# Patient Record
Sex: Male | Born: 1965
Health system: Southern US, Community
[De-identification: ages and names within clinical notes are randomized; demographics above are authoritative.]

## PROBLEM LIST (undated history)

## (undated) DIAGNOSIS — R51 Headache: Secondary | ICD-10-CM

## (undated) DIAGNOSIS — E039 Hypothyroidism, unspecified: Secondary | ICD-10-CM

## (undated) HISTORY — PX: VARICOCELECTOMY: SHX1084

## (undated) HISTORY — PX: HERNIA REPAIR: SHX51

---

## 2005-12-24 HISTORY — PX: COLONOSCOPY: SHX174

## 2011-01-14 ENCOUNTER — Encounter: Payer: Self-pay | Admitting: Internal Medicine

## 2011-12-25 HISTORY — PX: COLONOSCOPY W/ BIOPSIES AND POLYPECTOMY: SHX1376

## 2012-01-21 ENCOUNTER — Other Ambulatory Visit: Payer: Self-pay | Admitting: Internal Medicine

## 2012-01-21 DIAGNOSIS — E039 Hypothyroidism, unspecified: Secondary | ICD-10-CM

## 2012-01-23 ENCOUNTER — Other Ambulatory Visit: Payer: Self-pay

## 2012-09-09 ENCOUNTER — Encounter (HOSPITAL_COMMUNITY)
Admission: RE | Disposition: A | Discharge status: Home / Self Care | Payer: Self-pay | Source: Ambulatory Visit | Attending: Gastroenterology

## 2012-09-09 ENCOUNTER — Ambulatory Visit (HOSPITAL_COMMUNITY): Payer: 59 | Admitting: Anesthesiology

## 2012-09-09 ENCOUNTER — Encounter (HOSPITAL_COMMUNITY): Payer: Self-pay | Admitting: Anesthesiology

## 2012-09-09 ENCOUNTER — Ambulatory Visit (HOSPITAL_COMMUNITY)
Admission: RE | Admit: 2012-09-09 | Discharge: 2012-09-09 | Disposition: A | Discharge status: Home / Self Care | Payer: 59 | Source: Ambulatory Visit | Attending: Gastroenterology | Admitting: Gastroenterology

## 2012-09-09 DIAGNOSIS — Z800 Family history of malignant neoplasm of digestive organs: Secondary | ICD-10-CM | POA: Insufficient documentation

## 2012-09-09 DIAGNOSIS — Z1211 Encounter for screening for malignant neoplasm of colon: Secondary | ICD-10-CM | POA: Insufficient documentation

## 2012-09-09 DIAGNOSIS — D126 Benign neoplasm of colon, unspecified: Secondary | ICD-10-CM | POA: Insufficient documentation

## 2012-09-09 DIAGNOSIS — E039 Hypothyroidism, unspecified: Secondary | ICD-10-CM | POA: Insufficient documentation

## 2012-09-09 SURGERY — COLONOSCOPY WITH PROPOFOL
Anesthesia: Monitor Anesthesia Care

## 2012-09-09 MED ORDER — LACTATED RINGERS IV SOLN
INTRAVENOUS | Status: DC
  Administered 2012-09-09: 14:00:00 via INTRAVENOUS

## 2012-09-09 MED ORDER — SODIUM CHLORIDE 0.9 % IV SOLN: INTRAVENOUS | Status: DC

## 2012-09-09 MED ORDER — FENTANYL CITRATE 0.05 MG/ML IJ SOLN: 25.0000 ug | INTRAMUSCULAR | Status: DC | PRN

## 2012-09-09 MED ORDER — PROPOFOL 10 MG/ML IV EMUL
INTRAVENOUS | Status: DC | PRN
  Administered 2012-09-09: 250 ug/kg/min via INTRAVENOUS

## 2012-09-09 MED ORDER — PROPOFOL 10 MG/ML IV EMUL
INTRAVENOUS | Status: DC | PRN
  Administered 2012-09-09: 75 mg via INTRAVENOUS

## 2012-09-09 SURGICAL SUPPLY — 22 items

## 2012-09-09 NOTE — Discharge Instructions (Signed)
Colon Polyps A polyp is extra tissue that grows inside your body. Colon polyps grow in the large intestine. The large intestine, also called the colon, is part of your digestive system. It is a long, hollow tube at the end of your digestive tract where your body makes and stores stool. Most polyps are not dangerous. They are benign. This means they are not cancerous. But over time, some types of polyps can turn into cancer. Polyps that are smaller than a pea are usually not harmful. But larger polyps could someday become or may already be cancerous. To be safe, doctors remove all polyps and test them.  WHO GETS POLYPS? Anyone can get polyps, but certain people are more likely than others. You may have a greater chance of getting polyps if:  You are over 50.   You have had polyps before.   Someone in your family has had polyps.   Someone in your family has had cancer of the large intestine.   Find out if someone in your family has had polyps. You may also be more likely to get polyps if you:   Eat a lot of fatty foods.   Smoke.   Drink alcohol.   Do not exercise.   Eat too much.  SYMPTOMS  Most small polyps do not cause symptoms. People often do not know they have one until their caregiver finds it during a regular checkup or while testing them for something else. Some people do have symptoms like these:  Bleeding from the anus. You might notice blood on your underwear or on toilet paper after you have had a bowel movement.   Constipation or diarrhea that lasts more than a week.   Blood in the stool. Blood can make stool look black or it can show up as red streaks in the stool.  If you have any of these symptoms, see your caregiver. HOW DOES THE DOCTOR TEST FOR POLYPS? The doctor can use four tests to check for polyps:  Digital rectal exam. The caregiver wears gloves and checks your rectum (the last part of the large intestine) to see if it feels normal. This test would find  polyps only in the rectum. Your caregiver may need to do one of the other tests listed below to find polyps higher up in the intestine.   Barium enema. The caregiver puts a liquid called barium into your rectum before taking x-rays of your large intestine. Barium makes your intestine look white in the pictures. Polyps are dark, so they are easy to see.   Sigmoidoscopy. With this test, the caregiver can see inside your large intestine. A thin flexible tube is placed into your rectum. The device is called a sigmoidoscope, which has a light and a tiny video camera in it. The caregiver uses the sigmoidoscope to look at the last third of your large intestine.   Colonoscopy. This test is like sigmoidoscopy, but the caregiver looks at all of the large intestine. It usually requires sedation. This is the most common method for finding and removing polyps.  TREATMENT   The caregiver will remove the polyp during sigmoidoscopy or colonoscopy. The polyp is then tested for cancer.   If you have had polyps, your caregiver may want you to get tested regularly in the future.  PREVENTION  There is not one sure way to prevent polyps. You might be able to lower your risk of getting them if you:  Eat more fruits and vegetables and less fatty  food.   Do not smoke.   Avoid alcohol.   Exercise every day.   Lose weight if you are overweight.   Eating more calcium and folate can also lower your risk of getting polyps. Some foods that are rich in calcium are milk, cheese, and broccoli. Some foods that are rich in folate are chickpeas, kidney beans, and spinach.   Aspirin might help prevent polyps. Studies are under way.  Document Released: 09/05/2004 Document Revised: 11/29/2011 Document Reviewed: 02/11/2008 [facility] Patient Information 2012 [place], [place].  Colonoscopy Care After Read the instructions outlined below and refer to this sheet in the next few weeks. These discharge instructions provide you with  general information on caring for yourself after you leave the hospital. Your doctor may also give you specific instructions. While your treatment has been planned according to the most current medical practices available, unavoidable complications occasionally occur. If you have any problems or questions after discharge, call your doctor. HOME CARE INSTRUCTIONS ACTIVITY:  You may resume your regular activity, but move at a slower pace for the next 24 hours.   Take frequent rest periods for the next 24 hours.   Walking will help get rid of the air and reduce the bloated feeling in your belly (abdomen).   No driving for 24 hours (because of the medicine (anesthesia) used during the test).   You may shower.   Do not sign any important legal documents or operate any machinery for 24 hours (because of the anesthesia used during the test).  NUTRITION:  Drink plenty of fluids.   You may resume your normal diet as instructed by your doctor.   Begin with a light meal and progress to your normal diet. Heavy or fried foods are harder to digest and may make you feel sick to your stomach (nauseated).   Avoid alcoholic beverages for 24 hours or as instructed.  MEDICATIONS:  You may resume your normal medications unless your doctor tells you otherwise.  WHAT TO EXPECT TODAY:  Some feelings of bloating in the abdomen.   Passage of more gas than usual.   Spotting of blood in your stool or on the toilet paper.  IF YOU HAD POLYPS REMOVED DURING THE COLONOSCOPY:  No aspirin products for 7 days or as instructed.   No alcohol for 7 days or as instructed.   Eat a soft diet for the next 24 hours.  FINDING OUT THE RESULTS OF YOUR TEST Not all test results are available during your visit. If your test results are not back during the visit, make an appointment with your caregiver to find out the results. Do not assume everything is normal if you have not heard from your caregiver or the medical  facility. It is important for you to follow up on all of your test results.  SEEK IMMEDIATE MEDICAL CARE IF:  You have more than a spotting of blood in your stool.   Your belly is swollen (abdominal distention).   You are nauseated or vomiting.   You have a fever.   You have abdominal pain or discomfort that is severe or gets worse throughout the day.  Document Released: 07/24/2004 Document Revised: 11/29/2011 Document Reviewed: 07/22/2008 [facility] Patient Information 2012 [place], [place].  Sedation or Anesthesia, Adult Care After The medicines you received during the procedure or test today can sometimes cause you to be:  Confused.   Dizzy.   Sleepy.   Clumsy.  HOME CARE INSTRUCTIONS  Do not engage in any  activity that requires you to be alert or coordinated for the next 24 hours. This includes driving, operating heavy machinery, using power tools, cooking, climbing, or riding a bicycle.  No swimming, hot tubs, or baths for the next 24 hours.   Stay with family, friends, or a caregiver for the next 24 hours.   Do not make any important decisions in the next 24 hours, such as signing contracts, making important commitments, or making expensive purchases.   Do not drink alcohol for 24 hours.   Avoid solid food if you feel sick to your stomach (have nausea) or if you vomit.   Drink plenty of fluids.   Only take over-the-counter or prescription medicines for pain, discomfort, or fever as directed by your caregiver.   Call your caregiver if you have persistent nausea or if you vomit more than once.   If you cannot reach your caregiver, go to or contact the emergency department.  SEEK IMMEDIATE MEDICAL CARE IF:   You vomit more than once.   You have strange or unusual behavior.   You have a fever.   You have difficulty urinating.   You have severe pain.  Document Released: 12/10/2005 Document Revised: 11/29/2011 Document Reviewed: 01/17/2009 [facility] Patient  Information 2012 [place], [place].

## 2012-09-09 NOTE — H&P (Signed)
  Problem: Screening colonoscopy  History: The patient is a 46 year old male whose mother was diagnosed with colon polyps at age 51; his grandmother and grandfather was diagnosed with colon cancer. The patient underwent a normal screening colonoscopy in February 2006. He is scheduled to undergo a repeat screening colonoscopy today.  Past medical history: Hypothyroidism. Ocular migraine syndrome. Hernia repair. Varicocelectomy.  Habits: The patient has never smoked cigarettes and consumes alcohol in moderation.  Exam: The patient is alert and lying comfortably on the endoscopy stretcher. Sclera are nonicteric. Lungs are clear to auscultation. Cardiac exam reveals a regular rhythm without audible murmurs. Abdomen is soft, flat, and nontender to palpation in all quadrants.  Plan: Proceed with screening colonoscopy.

## 2012-09-09 NOTE — Anesthesia Postprocedure Evaluation (Signed)
  Anesthesia Post-op Note  Patient: Lucas Mills  Procedure(s) Performed: Procedure(s) (LRB): COLONOSCOPY WITH PROPOFOL (N/A)  Patient Location: PACU  Anesthesia Type: MAC  Level of Consciousness: awake and alert   Airway and Oxygen Therapy: Patient Spontanous Breathing  Post-op Pain: mild  Post-op Assessment: Post-op Vital signs reviewed, Patient's Cardiovascular Status Stable, Respiratory Function Stable, Patent Airway and No signs of Nausea or vomiting  Post-op Vital Signs: stable  Complications: No apparent anesthesia complications

## 2012-09-09 NOTE — Transfer of Care (Signed)
Immediate Anesthesia Transfer of Care Note  Patient: Lucas Mills  Procedure(s) Performed: Procedure(s) (LRB) with comments: COLONOSCOPY WITH PROPOFOL (N/A)  Patient Location: PACU  Anesthesia Type: MAC  Level of Consciousness: sedated  Airway & Oxygen Therapy: Patient Spontanous Breathing and Patient connected to face mask oxygen  Post-op Assessment: Report given to PACU RN and Post -op Vital signs reviewed and stable  Post vital signs: Reviewed and stable  Complications: No apparent anesthesia complications

## 2012-09-09 NOTE — Op Note (Signed)
Procedure: Screening colonoscopy.  Endoscopist: Danise Edge.  Premedication: Propofol administered by anesthesia.  Procedure: The patient was placed in the left lateral decubitus position. Anal inspection and digital rectal exam were normal. The Pentax pediatric colonoscope was introduced into the rectum and advanced to the cecum. A normal-appearing ileocecal valve and appendiceal orifice were identified. Colonic preparation for the exam today was good.  Rectum: Normal. Retroflexed view of the distal rectum normal. Sigmoid colon: Normal. Descending colon: At 50 cm from the anal verge a 5 mm sessile polyp was removed with the cold snare. Splenic flexure: Normal. Transverse colon: Normal. Hepatic flexure: Normal. Ascending colon: Normal. Cecum and ileocecal valve: Normal.  Assessment: A small polyp was removed from the descending colon; otherwise normal screening colonoscopy.  Recommendations: Await pathology report on the polyps removed.

## 2012-09-09 NOTE — Anesthesia Preprocedure Evaluation (Addendum)
Anesthesia Evaluation  Patient identified by MRN, date of birth, ID band Patient awake    Reviewed: Allergy & Precautions, H&P , NPO status , Patient's Chart, lab work & pertinent test results  Airway Mallampati: II TM Distance: >3 FB Neck ROM: full    Dental No notable dental hx. (+) Teeth Intact and Dental Advisory Given   Pulmonary neg pulmonary ROS,  breath sounds clear to auscultation  Pulmonary exam normal       Cardiovascular Exercise Tolerance: Good negative cardio ROS  Rhythm:regular Rate:Normal     Neuro/Psych negative neurological ROS  negative psych ROS   GI/Hepatic negative GI ROS, Neg liver ROS,   Endo/Other  negative endocrine ROS  Renal/GU negative Renal ROS  negative genitourinary   Musculoskeletal   Abdominal   Peds  Hematology negative hematology ROS (+)   Anesthesia Other Findings   Reproductive/Obstetrics negative OB ROS                          Anesthesia Physical Anesthesia Plan  ASA: I  Anesthesia Plan: MAC   Post-op Pain Management:    Induction:   Airway Management Planned:   Additional Equipment:   Intra-op Plan:   Post-operative Plan:   Informed Consent: I have reviewed the patients History and Physical, chart, labs and discussed the procedure including the risks, benefits and alternatives for the proposed anesthesia with the patient or authorized representative who has indicated his/her understanding and acceptance.   Dental Advisory Given  Plan Discussed with: CRNA and Surgeon  Anesthesia Plan Comments:         Anesthesia Quick Evaluation  

## 2013-09-29 ENCOUNTER — Encounter (HOSPITAL_COMMUNITY): Payer: Self-pay | Admitting: Pharmacy Technician

## 2013-10-01 ENCOUNTER — Other Ambulatory Visit (HOSPITAL_COMMUNITY): Payer: Self-pay | Admitting: *Deleted

## 2013-10-01 NOTE — Pre-Procedure Instructions (Signed)
MANOJ ENRIQUEZ  10/01/2013   Your procedure is scheduled on:  Thursday, October 08, 2013 at 71 Noon.   Report to [facility] Entrance "A" at 10:00 AM.   Call this number if you have problems the morning of surgery: [phone]   Remember:   Do not eat food or drink liquids after midnight Wednesday, 10/07/13.  Take these medicines the morning of surgery with A SIP OF WATER:  Synthroid (Levothyroxine)   Do not wear jewelry.  Do not wear lotions, powders, or cologne. You may wear deodorant.  Do not shave 48 hours prior to surgery. Men may shave face and neck.  Do not bring valuables to the hospital.  [facility] is not responsible                  for any belongings or valuables.               Contacts, dentures or bridgework may not be worn into surgery.  Leave suitcase in the car. After surgery it may be brought to your room.  For patients admitted to the hospital, discharge time is determined by your                treatment team.               Patients discharged the day of surgery will not be allowed to drive  home.  Name and phone number of your driver: Family/friend   Special Instructions: Shower using CHG 2 nights before surgery and the night before surgery.  If you shower the day of surgery use CHG.  Use special wash - you have one bottle of CHG for all showers.  You should use approximately 1/3 of the bottle for each shower.   Please read over the following fact sheets that you were given: Pain Booklet, Coughing and Deep Breathing and Surgical Site Infection Prevention

## 2013-10-02 ENCOUNTER — Encounter (HOSPITAL_COMMUNITY): Payer: Self-pay

## 2013-10-02 ENCOUNTER — Encounter (HOSPITAL_COMMUNITY)
Admission: RE | Admit: 2013-10-02 | Discharge: 2013-10-02 | Disposition: A | Discharge status: Home / Self Care | Payer: 59 | Source: Ambulatory Visit | Attending: Orthopedic Surgery | Admitting: Orthopedic Surgery

## 2013-10-02 DIAGNOSIS — Z01812 Encounter for preprocedural laboratory examination: Secondary | ICD-10-CM | POA: Insufficient documentation

## 2013-10-02 HISTORY — DX: Headache: R51

## 2013-10-02 HISTORY — DX: Hypothyroidism, unspecified: E03.9

## 2013-10-02 LAB — BASIC METABOLIC PANEL
BUN: 13 mg/dL (ref 6–23)
CO2: 28 meq/L (ref 19–32)
Calcium: 9.3 mg/dL (ref 8.4–10.5)
Chloride: 101 mEq/L (ref 96–112)
Creatinine, Ser: 1.15 mg/dL (ref 0.50–1.35)
GFR calc Af Amer: 85 mL/min — ABNORMAL LOW (ref >90)
GFR calc non Af Amer: 73 mL/min — ABNORMAL LOW (ref >90)
Glucose, Bld: 76 mg/dL (ref 70–99)
Potassium: 4 mEq/L (ref 3.5–5.1)
Sodium: 139 mEq/L (ref 135–145)

## 2013-10-02 LAB — CBC
HCT: 44.9 % (ref 39.0–52.0)
Hemoglobin: 15.9 g/dL (ref 13.0–17.0)
MCH: 29.1 pg (ref 26.0–34.0)
MCHC: 35.4 g/dL (ref 30.0–36.0)
MCV: 82.1 fL (ref 78.0–100.0)
Platelets: 181 10*3/uL (ref 150–400)
RBC: 5.47 MIL/uL (ref 4.22–5.81)
RDW: 12.8 % (ref 11.5–15.5)
WBC: 5.6 10*3/uL (ref 4.0–10.5)

## 2013-10-08 ENCOUNTER — Ambulatory Visit (HOSPITAL_COMMUNITY)
Admission: RE | Admit: 2013-10-08 | Discharge: 2013-10-08 | Disposition: A | Discharge status: Home / Self Care | Payer: 59 | Source: Ambulatory Visit | Attending: Orthopedic Surgery | Admitting: Orthopedic Surgery

## 2013-10-08 ENCOUNTER — Encounter (HOSPITAL_COMMUNITY): Payer: 59 | Admitting: Anesthesiology

## 2013-10-08 ENCOUNTER — Encounter (HOSPITAL_COMMUNITY): Payer: Self-pay | Admitting: Anesthesiology

## 2013-10-08 ENCOUNTER — Ambulatory Visit (HOSPITAL_COMMUNITY): Payer: 59 | Admitting: Anesthesiology

## 2013-10-08 ENCOUNTER — Encounter (HOSPITAL_COMMUNITY)
Admission: RE | Disposition: A | Discharge status: Home / Self Care | Payer: Self-pay | Source: Ambulatory Visit | Attending: Orthopedic Surgery

## 2013-10-08 DIAGNOSIS — Z79899 Other long term (current) drug therapy: Secondary | ICD-10-CM | POA: Insufficient documentation

## 2013-10-08 DIAGNOSIS — M24119 Other articular cartilage disorders, unspecified shoulder: Secondary | ICD-10-CM | POA: Insufficient documentation

## 2013-10-08 DIAGNOSIS — E039 Hypothyroidism, unspecified: Secondary | ICD-10-CM | POA: Insufficient documentation

## 2013-10-08 DIAGNOSIS — Z01812 Encounter for preprocedural laboratory examination: Secondary | ICD-10-CM | POA: Insufficient documentation

## 2013-10-08 HISTORY — PX: SHOULDER ARTHROSCOPY WITH LABRAL REPAIR: SHX5691

## 2013-10-08 LAB — CBC
HCT: 44.5 % (ref 39.0–52.0)
Hemoglobin: 15.2 g/dL (ref 13.0–17.0)
MCH: 28.3 pg (ref 26.0–34.0)
MCHC: 34.2 g/dL (ref 30.0–36.0)
MCV: 82.7 fL (ref 78.0–100.0)
Platelets: 184 10*3/uL (ref 150–400)
RBC: 5.38 MIL/uL (ref 4.22–5.81)
RDW: 12.9 % (ref 11.5–15.5)
WBC: 5.2 10*3/uL (ref 4.0–10.5)

## 2013-10-08 LAB — BASIC METABOLIC PANEL
BUN: 10 mg/dL (ref 6–23)
CO2: 23 mEq/L (ref 19–32)
Calcium: 9.3 mg/dL (ref 8.4–10.5)
Chloride: 102 mEq/L (ref 96–112)
Creatinine, Ser: 1.09 mg/dL (ref 0.50–1.35)
GFR calc Af Amer: >90 mL/min (ref >90)
GFR calc non Af Amer: 78 mL/min — ABNORMAL LOW (ref >90)
Glucose, Bld: 92 mg/dL (ref 70–99)
Potassium: 3.9 mEq/L (ref 3.5–5.1)
Sodium: 137 mEq/L (ref 135–145)

## 2013-10-08 SURGERY — ARTHROSCOPY, SHOULDER, WITH GLENOID LABRUM REPAIR
Anesthesia: General | Site: Shoulder | Laterality: Left | Wound class: Clean

## 2013-10-08 MED ORDER — DEXAMETHASONE SODIUM PHOSPHATE 4 MG/ML IJ SOLN
INTRAMUSCULAR | Status: DC | PRN
  Administered 2013-10-08: 4 mg

## 2013-10-08 MED ORDER — EPHEDRINE SULFATE 50 MG/ML IJ SOLN
INTRAMUSCULAR | Status: DC | PRN
  Administered 2013-10-08: 5 mg via INTRAVENOUS

## 2013-10-08 MED ORDER — OXYCODONE HCL 5 MG PO TABS: 5.0000 mg | ORAL_TABLET | Freq: Once | ORAL | Status: DC | PRN

## 2013-10-08 MED ORDER — ONDANSETRON HCL 4 MG/2ML IJ SOLN
INTRAMUSCULAR | Status: DC | PRN
  Administered 2013-10-08: 4 mg via INTRAMUSCULAR

## 2013-10-08 MED ORDER — GLYCOPYRROLATE 0.2 MG/ML IJ SOLN
INTRAMUSCULAR | Status: DC | PRN
  Administered 2013-10-08: 0.6 mg via INTRAVENOUS

## 2013-10-08 MED ORDER — OXYCODONE HCL 5 MG/5ML PO SOLN: 5.0000 mg | Freq: Once | ORAL | Status: DC | PRN

## 2013-10-08 MED ORDER — MIDAZOLAM HCL 2 MG/2ML IJ SOLN
INTRAMUSCULAR | Status: AC
  Filled 2013-10-08: qty 2

## 2013-10-08 MED ORDER — FENTANYL CITRATE 0.05 MG/ML IJ SOLN
50.0000 ug | Freq: Once | INTRAMUSCULAR | Status: AC
  Administered 2013-10-08: 200 ug via INTRAVENOUS
  Filled 2013-10-08: qty 2

## 2013-10-08 MED ORDER — MIDAZOLAM HCL 2 MG/2ML IJ SOLN
1.0000 mg | INTRAMUSCULAR | Status: DC | PRN
  Administered 2013-10-08: 4 mg via INTRAVENOUS
  Filled 2013-10-08: qty 2

## 2013-10-08 MED ORDER — FENTANYL CITRATE 0.05 MG/ML IJ SOLN
INTRAMUSCULAR | Status: AC
  Filled 2013-10-08: qty 2

## 2013-10-08 MED ORDER — ROCURONIUM BROMIDE 100 MG/10ML IV SOLN
INTRAVENOUS | Status: DC | PRN
  Administered 2013-10-08: 50 mg via INTRAVENOUS

## 2013-10-08 MED ORDER — LACTATED RINGERS IV SOLN
INTRAVENOUS | Status: DC | PRN
  Administered 2013-10-08: 11:00:00 via INTRAVENOUS

## 2013-10-08 MED ORDER — TEMAZEPAM 30 MG PO CAPS: 30.0000 mg | ORAL_CAPSULE | Freq: Every evening | ORAL | Status: DC | PRN

## 2013-10-08 MED ORDER — CEFAZOLIN SODIUM-DEXTROSE 2-3 GM-% IV SOLR
2.0000 g | Freq: Once | INTRAVENOUS | Status: AC
  Administered 2013-10-08: 2 g via INTRAVENOUS
  Filled 2013-10-08: qty 50

## 2013-10-08 MED ORDER — SODIUM CHLORIDE 0.9 % IR SOLN
Status: DC | PRN
  Administered 2013-10-08 (×3): 3000 mL

## 2013-10-08 MED ORDER — ARTIFICIAL TEARS OP OINT
TOPICAL_OINTMENT | OPHTHALMIC | Status: DC | PRN
  Administered 2013-10-08: 1 via OPHTHALMIC

## 2013-10-08 MED ORDER — LIDOCAINE HCL (CARDIAC) 20 MG/ML IV SOLN
INTRAVENOUS | Status: DC | PRN
  Administered 2013-10-08: 80 mg via INTRAVENOUS

## 2013-10-08 MED ORDER — BUPIVACAINE-EPINEPHRINE PF 0.5-1:200000 % IJ SOLN
INTRAMUSCULAR | Status: DC | PRN
  Administered 2013-10-08: 30 mL

## 2013-10-08 MED ORDER — PROPOFOL 10 MG/ML IV BOLUS
INTRAVENOUS | Status: DC | PRN
  Administered 2013-10-08: 200 mg via INTRAVENOUS

## 2013-10-08 MED ORDER — HYDROMORPHONE HCL PF 1 MG/ML IJ SOLN: 0.2500 mg | INTRAMUSCULAR | Status: DC | PRN

## 2013-10-08 MED ORDER — MIDAZOLAM HCL 5 MG/5ML IJ SOLN
INTRAMUSCULAR | Status: DC | PRN
  Administered 2013-10-08 (×2): 1 mg via INTRAVENOUS

## 2013-10-08 MED ORDER — LACTATED RINGERS IV SOLN
INTRAVENOUS | Status: DC
  Administered 2013-10-08: 11:00:00 via INTRAVENOUS

## 2013-10-08 MED ORDER — NEOSTIGMINE METHYLSULFATE 1 MG/ML IJ SOLN
INTRAMUSCULAR | Status: DC | PRN
  Administered 2013-10-08: 3 mg via INTRAVENOUS

## 2013-10-08 MED ORDER — PROMETHAZINE HCL 25 MG/ML IJ SOLN: 6.2500 mg | INTRAMUSCULAR | Status: DC | PRN

## 2013-10-08 MED ORDER — CEFAZOLIN SODIUM-DEXTROSE 2-3 GM-% IV SOLR
INTRAVENOUS | Status: AC
  Filled 2013-10-08: qty 50

## 2013-10-08 MED ORDER — CYCLOBENZAPRINE HCL 10 MG PO TABS: 10.0000 mg | ORAL_TABLET | Freq: Three times a day (TID) | ORAL | Status: DC | PRN

## 2013-10-08 MED ORDER — NAPROXEN 500 MG PO TABS: 500.0000 mg | ORAL_TABLET | Freq: Two times a day (BID) | ORAL | Status: DC

## 2013-10-08 MED ORDER — OXYCODONE-ACETAMINOPHEN 5-325 MG PO TABS: 1.0000 | ORAL_TABLET | ORAL | Status: DC | PRN

## 2013-10-08 SURGICAL SUPPLY — 62 items
ANCH SUT 1.4 1 LD SFT TIS (Anchor) ×5 IMPLANT
ANCHOR JUGGERKNOT SZ1 (Anchor) ×5 IMPLANT
BLADE CUTTER GATOR 3.5 (BLADE) ×2 IMPLANT
BLADE GREAT WHITE 4.2 (BLADE) ×2 IMPLANT
BLADE SURG 11 STRL SS (BLADE) ×2 IMPLANT
BOOTCOVER CLEANROOM LRG (PROTECTIVE WEAR) ×4 IMPLANT
BUR OVAL 4.0 (BURR) ×2 IMPLANT
CANISTER SUCT LVC 12 LTR MEDI- (MISCELLANEOUS) ×2 IMPLANT
CANNULA ACUFLEX KIT 5X76 (CANNULA) ×2 IMPLANT
CANNULA DRILOCK 5.0X75 (CANNULA) ×2 IMPLANT
CLOTH BEACON ORANGE TIMEOUT ST (SAFETY) ×2 IMPLANT
CONNECTOR 5 IN 1 STRAIGHT STRL (MISCELLANEOUS) IMPLANT
DRAPE INCISE 23X17 IOBAN STRL (DRAPES) ×1
DRAPE INCISE 23X17 STRL (DRAPES) ×1 IMPLANT
DRAPE INCISE IOBAN 23X17 STRL (DRAPES) ×1 IMPLANT
DRAPE STERI 35X30 U-POUCH (DRAPES) ×2 IMPLANT
DRAPE SURG 17X11 SM STRL (DRAPES) ×2 IMPLANT
DRAPE U-SHAPE 47X51 STRL (DRAPES) ×2 IMPLANT
DRSG PAD ABDOMINAL 8X10 ST (GAUZE/BANDAGES/DRESSINGS) ×3 IMPLANT
DURAPREP 26ML APPLICATOR (WOUND CARE) ×2 IMPLANT
ELECT REM PT RETURN 9FT ADLT (ELECTROSURGICAL) ×2
ELECTRODE REM PT RTRN 9FT ADLT (ELECTROSURGICAL) ×1 IMPLANT
GLOVE BIO SURGEON STRL SZ7.5 (GLOVE) ×2 IMPLANT
GLOVE BIO SURGEON STRL SZ8 (GLOVE) ×2 IMPLANT
GLOVE EUDERMIC 7 POWDERFREE (GLOVE) ×2 IMPLANT
GLOVE SS BIOGEL STRL SZ 7.5 (GLOVE) ×1 IMPLANT
GLOVE SUPERSENSE BIOGEL SZ 7.5 (GLOVE) ×1
GOWN STRL NON-REIN LRG LVL3 (GOWN DISPOSABLE) ×2 IMPLANT
GOWN STRL REIN XL XLG (GOWN DISPOSABLE) ×4 IMPLANT
JUGGERKNOT DISPOSABLE KIT 1.4MM ×1 IMPLANT
JUGGERKNOT SHORT SET 1.4MM (KITS) IMPLANT
KIT BASIN OR (CUSTOM PROCEDURE TRAY) ×2 IMPLANT
KIT ROOM TURNOVER OR (KITS) ×2 IMPLANT
KIT SHOULDER TRACTION (DRAPES) ×2 IMPLANT
LASSO 45 DEG CVD LEFT (SUTURE) ×1 IMPLANT
MANIFOLD NEPTUNE II (INSTRUMENTS) ×2 IMPLANT
NDL SPNL 18GX3.5 QUINCKE PK (NEEDLE) ×1 IMPLANT
NDL SUT 6 .5 CRC .975X.05 MAYO (NEEDLE) IMPLANT
NEEDLE MAYO TAPER (NEEDLE)
NEEDLE SPNL 18GX3.5 QUINCKE PK (NEEDLE) ×2 IMPLANT
NS IRRIG 1000ML POUR BTL (IV SOLUTION) ×2 IMPLANT
PACK SHOULDER (CUSTOM PROCEDURE TRAY) ×2 IMPLANT
PAD ARMBOARD 7.5X6 YLW CONV (MISCELLANEOUS) ×4 IMPLANT
SET ARTHROSCOPY TUBING (MISCELLANEOUS) ×2
SET ARTHROSCOPY TUBING LN (MISCELLANEOUS) ×1 IMPLANT
SET JUGGERKNOT DISP 1.4MM ×1 IMPLANT
SLING ARM IMMOBILIZER LRG (SOFTGOODS) ×2 IMPLANT
SLING ARM IMMOBILIZER MED (SOFTGOODS) IMPLANT
SPONGE GAUZE 4X4 12PLY (GAUZE/BANDAGES/DRESSINGS) ×2 IMPLANT
SPONGE LAP 4X18 X RAY DECT (DISPOSABLE) ×2 IMPLANT
STRIP CLOSURE SKIN 1/2X4 (GAUZE/BANDAGES/DRESSINGS) ×2 IMPLANT
SUT MNCRL AB 3-0 PS2 18 (SUTURE) ×2 IMPLANT
SUT PDS AB 0 CT 36 (SUTURE) ×2 IMPLANT
SUT PDS AB 1 CT  36 (SUTURE) ×1
SUT PDS AB 1 CT 36 (SUTURE) IMPLANT
SYR 20CC LL (SYRINGE) ×2 IMPLANT
TAPE CLOTH 3X10 TAN LF (GAUZE/BANDAGES/DRESSINGS) ×1 IMPLANT
TAPE PAPER 3X10 WHT MICROPORE (GAUZE/BANDAGES/DRESSINGS) ×2 IMPLANT
TOWEL OR 17X24 6PK STRL BLUE (TOWEL DISPOSABLE) ×2 IMPLANT
TOWEL OR 17X26 10 PK STRL BLUE (TOWEL DISPOSABLE) ×2 IMPLANT
WAND SUCTION MAX 4MM 90S (SURGICAL WAND) ×2 IMPLANT
WATER STERILE IRR 1000ML POUR (IV SOLUTION) ×2 IMPLANT

## 2013-10-08 NOTE — Transfer of Care (Signed)
Immediate Anesthesia Transfer of Care Note  Patient: Lucas Mills  Procedure(s) Performed: Procedure(s): LEFT SHOULDER ARTHROSCOPY WITH LABRAL REPAIR (Left)  Patient Location: PACU  Anesthesia Type:General  Level of Consciousness: awake, alert  and oriented  Airway & Oxygen Therapy: Patient Spontanous Breathing and Patient connected to nasal cannula oxygen  Post-op Assessment: Report given to PACU RN, Post -op Vital signs reviewed and stable and Patient moving all extremities X 4  Post vital signs: Reviewed and stable  Complications: No apparent anesthesia complications

## 2013-10-08 NOTE — Anesthesia Preprocedure Evaluation (Signed)
Anesthesia Evaluation  Patient identified by MRN, date of birth, ID band Patient awake    Reviewed: Allergy & Precautions, H&P , NPO status , Patient's Chart, lab work & pertinent test results  Airway Mallampati: I TM Distance: >3 FB     Dental   Pulmonary  breath sounds clear to auscultation        Cardiovascular Rate:Normal     Neuro/Psych    GI/Hepatic   Endo/Other  Hypothyroidism   Renal/GU      Musculoskeletal   Abdominal   Peds  Hematology   Anesthesia Other Findings   Reproductive/Obstetrics                           Anesthesia Physical Anesthesia Plan  ASA: I  Anesthesia Plan: General   Post-op Pain Management:    Induction: Intravenous  Airway Management Planned: Oral ETT  Additional Equipment:   Intra-op Plan:   Post-operative Plan: Extubation in OR  Informed Consent: I have reviewed the patients History and Physical, chart, labs and discussed the procedure including the risks, benefits and alternatives for the proposed anesthesia with the patient or authorized representative who has indicated his/her understanding and acceptance.     Plan Discussed with: CRNA and Surgeon  Anesthesia Plan Comments:         Anesthesia Quick Evaluation

## 2013-10-08 NOTE — H&P (Signed)
Lucas Mills    Chief Complaint: LEFT SHOULDER LABRAL TEAR  HPI: The patient is a 47 y.o. male with chronic left shoulder pain and clinical and MRI evidence for glenoid labral tear  Past Medical History  Diagnosis Date  . Hypothyroidism   . Headache(784.0)     ocular migraines     Past Surgical History  Procedure Laterality Date  . Hernia repair Left   . Colonoscopy  2007  . Colonoscopy w/ biopsies and polypectomy  2013    polpy removed  . Varicocelectomy Left     with mesh    No family history on file.  Social History:  reports that he has never smoked. He has never used smokeless tobacco. He reports that he drinks alcohol. He reports that he does not use illicit drugs.  Allergies: No Known Allergies  Medications Prior to Admission  Medication Sig Dispense Refill  . levothyroxine (SYNTHROID, LEVOTHROID) 112 MCG tablet Take 112 mcg by mouth daily.         Physical Exam: Left shoulder with pain and mechanical symptoms as noted at recent office visit  Vitals  Pulse Rate:  [57-88] 77 (10/16 1129) Resp:  [12-22] 19 (10/16 1129) BP: (131-150)/(80-88) 141/80 mmHg (10/16 1128) SpO2:  [98 %-100 %] 98 % (10/16 1129)  Assessment/Plan  Impression: LEFT SHOULDER LABRAL TEAR   Plan of Action: Procedure(s): LEFT SHOULDER ARTHROSCOPY WITH LABRAL REPAIR  Amarii Amy M 10/08/2013, 11:32 AM

## 2013-10-08 NOTE — Anesthesia Postprocedure Evaluation (Signed)
  Anesthesia Post-op Note  Patient: Lucas Mills  Procedure(s) Performed: Procedure(s): LEFT SHOULDER ARTHROSCOPY WITH LABRAL REPAIR (Left)  Patient Location: PACU  Anesthesia Type:GA combined with regional for post-op pain  Level of Consciousness: awake and alert   Airway and Oxygen Therapy: Patient Spontanous Breathing  Post-op Pain: none  Post-op Assessment: Post-op Vital signs reviewed, Patient's Cardiovascular Status Stable, Respiratory Function Stable, Patent Airway, No signs of Nausea or vomiting and Pain level controlled  Post-op Vital Signs: Reviewed and stable  Complications: No apparent anesthesia complications

## 2013-10-08 NOTE — Preoperative (Signed)
Beta Blockers   Reason not to administer Beta Blockers:Not Applicable 

## 2013-10-08 NOTE — Op Note (Signed)
10/08/2013  1:53 PM  PATIENT:   Lucas Mills  47 y.o. male  PRE-OPERATIVE DIAGNOSIS:  LEFT SHOULDER LABRAL TEAR   POST-OPERATIVE DIAGNOSIS:  Anterior and posterior labral tears left shoulder  PROCEDURE:  Anterior and posterior labral repairs left shoulder  SURGEON:  Weyman Bogdon, Vania Rea M.D.  ASSISTANTS: Shuford pac   ANESTHESIA:   GET + ISB  EBL: min  SPECIMEN:  none  Drains: none   PATIENT DISPOSITION:  PACU - hemodynamically stable.    PLAN OF CARE: Discharge to home after PACU  Dictation# [id number]

## 2013-10-08 NOTE — Discharge Instructions (Signed)
   Kevin M. Supple, M.D., F.A.A.O.S. °Orthopaedic Surgery °Specializing in Arthroscopic and Reconstructive °Surgery of the Shoulder and Knee °[phone] °[address] - [place], [place] [address] - Fax [phone] ° °POST-OP SHOULDER ARTHROSCOPIC ROTATOR CUFF AND/OR LABRAL REPAIR INSTRUCTIONS ° °1. Call the office at [phone] to schedule your first post-op appointment 7-10 days from the date of your surgery. ° °2. Leave the steri-strips in place over your incisions when performing dressing changes and showering. You may remove your dressings and begin showering 72 hours from surgery. You can expect drainage that is clear to bloody in nature that occasionally will soak through your dressings. If this occurs go ahead and perform a dressing change. The drainage should lessen daily and when there is no drainage from your incisions feel free to go without a dressing. ° °3. Wear your sling/immobilizer at all times except to perform the exercises below or to occasionally let your arm dangle by your side to stretch your elbow. You also need to sleep in your sling immobilizer until instructed otherwise. ° °4. Range of motion to your elbow, wrist, and hand are encouraged 3-5 times daily. Exercise to your hand and fingers helps to reduce swelling you may experience. ° °5. Utilize ice to the shoulder 3-4 times minimum a day and additionally if you are experiencing pain. ° °6. You may one-armed drive when safely off of narcotics and muscle relaxants. You may use your hand that is in the sling to support the steering wheel only. However, should it be your right arm that is in the sling it is not to be used for gear shifting in a manual transmission. ° °7. If you had a block pre-operatively to provide post-op pain relief you may want to go ahead and begin utilizing your pain meds as your arm begins to wake up. Blocks can sometimes last up to 16-18 hours. If you are still pain-free prior to going to bed you may  want to strongly consider taking a pain medication to avoid being awakened in the night with the onset of pain. A muscle relaxant is also provided for you should you experience muscle spasms. It is recommended that if you are experiencing pain that your pain medication alone is not controlling, add the muscle relaxant along with the pain medication which can give additional pain relief. The first one to two days is generally the most severe of your pain and then should gradually decrease. As your pain lessens it is recommended that you decrease your use of the pain medications to an "as needed basis" only and to always comply with the recommended dosages of the pain medications. ° °8. Pain medications can produce constipation along with their use. If you experience this, the use of an over the counter stool softener or laxative daily is recommended.  ° °9. For additional questions or concerns, please do not hesitate to call the office. If after hours there is an answering service to forward your concerns to the physician on call. ° °POST-OP EXERCISES ° °Pendulum Exercises ° °Perform pendulum exercises while standing and bending at the waist. Support your uninvolved arm on a table or chair and allow your operated arm to hang freely. Make sure to do these exercises passively - not using you shoulder muscle. ° °Repeat 20 times. Do 3 sessions per day. ° ° °

## 2013-10-08 NOTE — Anesthesia Procedure Notes (Addendum)
Anesthesia Regional Block:  Interscalene brachial plexus block  Pre-Anesthetic Checklist: ,, timeout performed, Correct Patient, Correct Site, Correct Laterality, Correct Procedure, Correct Position, site marked, Risks and benefits discussed,  Surgical consent,  Pre-op evaluation,  At surgeon's request and post-op pain management  Laterality: Left  Prep: chloraprep       Needles:  Injection technique: Single-shot  Needle Type: Echogenic Stimulator Needle     Needle Length: 5cm 5 cm Needle Gauge: 22 and 22 G    Additional Needles:  Procedures: ultrasound guided (picture in chart) and nerve stimulator Interscalene brachial plexus block  Nerve Stimulator or Paresthesia:  Response: 0.48 mA,   Additional Responses:   Narrative:  Start time: 10/08/2013 10:56 AM End time: 10/08/2013 11:14 AM Injection made incrementally with aspirations every 3 mL. Anesthesiologist: Dr Gypsy Balsam  Additional Notes: [id number] L ISB POP CHG prep, sterile tech Somewhat challenging due to patient movement despite heavy sedation #22 stim/echo needle with stim down to .48ma and good [place] visualization-PIX in chart Multiple neg asp Marc .5% w/epi 1:200000 total 30cc+decadron 4mg  infiltrated No compl Dr Gypsy Balsam  Interscalene brachial plexus block Procedure Name: Intubation Date/Time: 10/08/2013 12:16 PM Performed by: Gayla Medicus Pre-anesthesia Checklist: Patient identified, Timeout performed, Emergency Drugs available, Suction available and Patient being monitored Patient Re-evaluated:Patient Re-evaluated prior to inductionOxygen Delivery Method: Circle system utilized Preoxygenation: Pre-oxygenation with 100% oxygen Intubation Type: IV induction Ventilation: Mask ventilation without difficulty Laryngoscope Size: Mac and 3 Grade View: Grade I Tube type: Oral Tube size: 7.5 mm Number of attempts: 1 Airway Equipment and Method: Stylet Placement Confirmation: ETT inserted through vocal cords  under direct vision,  positive ETCO2 and breath sounds checked- equal and bilateral Secured at: 22 cm Tube secured with: Tape Dental Injury: Teeth and Oropharynx as per pre-operative assessment

## 2013-10-09 NOTE — Op Note (Signed)
NAME:  Lucas Mills, Lucas Mills NO.:  [id number]  MEDICAL RECORD NO.:  [id number]  LOCATION:  [facility]                         FACILITY:  [facility]  PHYSICIAN:  Vania Rea. Dawnn Nam, M.D.  DATE OF BIRTH:  [birth year]  DATE OF PROCEDURE:  10/08/2013 DATE OF DISCHARGE:  10/08/2013                              OPERATIVE REPORT   PREOPERATIVE DIAGNOSIS:  Chronic left shoulder pain with clinical examination suggesting instability and MRI scan showing evidence for a large inferior labral tear, predominantly anteroinferiorly.  POSTOPERATIVE DIAGNOSIS:  Left shoulder posterior-inferior and anterior- inferior labral tear with evidence for anterior-inferior instability.  PROCEDURES: 1. Left shoulder examination under anesthesia. 2. Left shoulder diagnostic arthroscopy. 3. Repair of anterior labral tear. 4. Repair of posterior labral tear.  SURGEON:  Vania Rea. Mohamad Bruso, MD  ASSISTANT:  Lucita Lora. Shuford, PA-C  ANESTHESIA:  General endotracheal as well as an interscalene block.  ESTIMATED BLOOD LOSS:  Minimal.  DRAINS:  None.  HISTORY:  Lucas Mills is a 47 year old gentleman who has had chronic and progressive increasing left shoulder pain with functional limitations, intermittent mechanical symptoms with examination suggested for labral pathology as well as associated mild shoulder instability.  His preoperative MRI scan shows a complex tear of the inferior labrum extending from anterior to posterior with an extensive paralabral cyst and some cystic erosion of the inferior glenoid, suggesting a chronic paralabral cyst situation.  Due to his persistent pain and function limitations, he was brought to the operating room at this time for a planned left shoulder arthroscopy as described below.  Preoperatively, I counseled Lucas Mills on treatment options as well as risks versus benefits thereof.  Possible surgical complications were reviewed including potential for bleeding, infection,  neurovascular injury, persistent pain, loss of motion, recurrence of instability, anesthetic complication, and possible need for additional surgery.  He understands and accepts and agrees with our planned procedure.  PROCEDURE IN DETAIL:  After undergoing routine preop evaluation, the patient received prophylactic antibiotics and interscalene block was established in the holding area the Anesthesia Department.  Placed supine on the operating table, underwent smooth induction of a general endotracheal anesthesia.  Turned to the right lateral decubitus position on a beanbag and appropriately padded and protected.  Left shoulder examination under anesthesia revealed full motion.  Had evidence for anteroinferior instability.  They did not find any evidence to suggest posterior instability.  The left arm was then suspended at 70 degrees abduction with 10 pounds of traction.  Left shoulder girdle region was sterilely prepped and draped in standard fashion.  Time-out was called. Posterior portal was established in the glenohumeral joint.  Anterior portal was established under direct visualization.  The articular surfaces were all in excellent condition.  We did find a small tear of the upper subscapularis which we debrided.  The balance of the rotator cuff superiorly and posteriorly was intact and pristine.  The superior labrum was stable with stable biceps anchor.  We identified a significant inferior labral tear which extended from the 4 o'clock position posteriorly to the 9 o'clock position anteriorly.  This was associated with anteroinferior instability of the shoulder.  However, there is no evidence for Hill-Sachs defect and  so suggestive of a subluxation type instability pattern.  The overall quality of the anterior band of the infraglenoid ligament appeared to be quite good and there appeared to be minimal expansion of the axillary pouch.  Given these findings, we elected to proceed with  a labral repair/reconstruction.  I used a periosteal elevator to elevate the scarified labral tissue and associated capsular attachments from 9 o'clock anteriorly down past the 6 o'clock to the 4 o'clock position posteriorly.  Once the labrum was completely mobilized, I then used a ball rasp to create a bony bed at the articular margin of the glenoid again from 9 o'clock back to 4 o'clock.  Also, all soft tissue debris was then removed with a shaver.  An accessory anterior-inferior portal was established with the upper margin of the subscapularis.  I placed a series of 3 juggernaut suture anchors in the anterior inferior quadrant at the 6:30, 7:30, and 8:30 positions.  These suture limbs were then shuttled through the adjacent aspect of the labrum in the anterior band and the inferior glenohumeral ligament in horizontal mattress pattern, allowing a superior ward shift of the tissues and creating an appropriate anterior-inferior capsulolabral "bumper."  These were all tied with standard sliding locking knots followed by multiple overhand throws and alternating posts.  Once this was completed, we turned our attention posteriorly where in a similar fashion established an accessory posterior-inferior portal and prepared the bed at the 5 o'clock position with rasp, removed soft tissue, placed a single juggernaut suture anchor at the 5 o'clock position and the suture limbs were then shuttled through the adjacent aspect of the posterior-inferior labrum and capsule and then tied this again with sliding locking knots followed by multiple overhand throws and alternating posts.  This allowed excellent reapposition of the labrum and capsular tissues to the margin of the glenoid for the entire length of the labral tear and the overall construct and security was much to our satisfaction.  All suture limbs were appropriately cut.  Tracy A. Shuford, PA-C, was used as an Geophysicist/field seismologist throughout this  case, essential for help with positioning of the extremity, management of the arthroscopic equipment, tissue manipulation, suture management, wound closure, and intraoperative decision making.  At the closure of the case, fluid and instruments were removed.  The portals were closed with Monocryl and Steri-Strips.  Dry dressing tamped at the left shoulder. Left arm was placed in a sling immobilizer.  The patient was awakened, extubated, and taken to the recovery room in stable condition.     Vania Rea. Plez Belton, M.D.     KMS/MEDQ  D:  10/08/2013  T:  10/09/2013  Job:  [id number]

## 2013-10-12 ENCOUNTER — Encounter (HOSPITAL_COMMUNITY): Payer: Self-pay | Admitting: Orthopedic Surgery

## 2013-10-29 ENCOUNTER — Other Ambulatory Visit: Payer: Self-pay

## 2014-07-13 ENCOUNTER — Ambulatory Visit (INDEPENDENT_AMBULATORY_CARE_PROVIDER_SITE_OTHER): Payer: 59 | Admitting: Sports Medicine

## 2014-07-13 ENCOUNTER — Encounter: Payer: Self-pay | Admitting: Sports Medicine

## 2014-07-13 VITALS — BP 120/70 | Ht 69.0 in | Wt 160.0 lb

## 2014-07-13 DIAGNOSIS — M7710 Lateral epicondylitis, unspecified elbow: Secondary | ICD-10-CM

## 2014-07-13 DIAGNOSIS — M7711 Lateral epicondylitis, right elbow: Secondary | ICD-10-CM

## 2014-07-13 MED ORDER — NITROGLYCERIN 0.2 MG/HR TD PT24: MEDICATED_PATCH | TRANSDERMAL | Status: DC

## 2014-07-13 NOTE — Patient Instructions (Addendum)

## 2014-07-13 NOTE — Progress Notes (Signed)
   Subjective:    Patient ID: Lucas Mills, male    DOB: [birth year], 48 y.o.   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 He is right-hand dominant. Pain began as a result of an altered golf swing due to a chronic shoulder problem. He began to experience a burning type sensation over the lateral elbow with activity which has begun to improve over the past couple of weeks after he altered his golf swing back to normal. He has tried a compression sleeve and has been doing some forearm extensor stretches and as a result his pain has improved but has not completely resolved. Pain originates over the lateral epicondyle but at times will radiate into the forearm as well as up into the triceps. No swelling. He denies any pain along the medial elbow. No numbness and tingling. His pain is most noticeable with activity such a shaking hands or holding a coffee cup. No prior elbow surgeries.  Past medical history reviewed. He has a history of ocular migraines but it has been 2 years since he had a headache. Current medications reviewed Allergies reviewed    Review of Systems    as above Objective:   Physical Exam Well-developed,.. No distress. Awake alert and oriented x3. Vital signs are reviewed.  Right elbow: Full range of motion. No effusion. No soft tissue swelling. There is tenderness to palpation directly over the lateral epicondyle and reproducible pain with ECRB testing. No tenderness over the medial epicondyle. Negative Tinel's over the cubital tunnel. Decreased grip strength secondary to pain. There is no tenderness over the triceps insertion onto the olecranon. Good triceps strength. Good radial and ulnar pulses distally.  MSK ultrasound of the right elbow was performed. There is a small hypoechoic area at the insertion of the ECRB tendon onto the lateral epicondyle. It is certainly not full thickness.  Small underlying joint effusion as well. No spurs appreciated.       Assessment & Plan:  Right elbow pain secondary to lateral condylitis with ultrasound evidence of a small intrasubstance tear  We are going to try our topical nitroglycerin protocol. He will utilize a quarter patch daily. He is cautioned about the possibility of headaches and is instructed to discontinue these immediately if he has a recurrence of his ocular migraines. He understands. He will also start an eccentric home exercise program. He can continue with his compression wrap and daily icing. He can also try an off the shelf counterforce brace. Continue with activity as tolerated as well. Followup with me in about a month for reexamination and repeat ultrasound. Call with questions or concerns in the interim.

## 2014-08-09 ENCOUNTER — Ambulatory Visit (INDEPENDENT_AMBULATORY_CARE_PROVIDER_SITE_OTHER): Payer: 59 | Admitting: Sports Medicine

## 2014-08-09 ENCOUNTER — Encounter: Payer: Self-pay | Admitting: Sports Medicine

## 2014-08-09 VITALS — BP 112/74 | Ht 69.0 in | Wt 160.0 lb

## 2014-08-09 DIAGNOSIS — M7711 Lateral epicondylitis, right elbow: Secondary | ICD-10-CM | POA: Insufficient documentation

## 2014-08-09 DIAGNOSIS — M7710 Lateral epicondylitis, unspecified elbow: Secondary | ICD-10-CM

## 2014-08-09 MED ORDER — NITROGLYCERIN 0.2 MG/HR TD PT24: MEDICATED_PATCH | TRANSDERMAL | Status: DC

## 2014-08-09 NOTE — Progress Notes (Signed)
   Subjective:    Patient ID: Lucas Mills, male    DOB: 10-Jul-1966, 48 y.o.   MRN: [id number]  HPI Patient comes in today for followup on right elbow lateral epicondylitis. Overall, he feels like things are improving. He is able to shake hands much easier. He has been golfing as well. He is tolerating the nitroglycerin protocol and he is doing his eccentric exercises. He is also doing some cryotherapy which he thinks has been helpful.    Review of Systems     Objective:   Physical Exam Well-developed, well-nourished. No acute distress  Right elbow: There is still tenderness to palpation at the lateral epicondyle. There is still some mildly reproducible pain with resisted ECRB testing as well. No soft tissue swelling. No joint effusion. Good grip strength. Good radial ulnar pulses.  MSK ultrasound of the right elbow: Limited images of the lateral epicondyle were obtained. The hypoechoic area seen on his previous scan is much smaller today. Findings consistent with a healing partial common extensor tendon tear.       Assessment & Plan:  Improving right elbow pain secondary to lateral epicondylitis/partial common extensor tendon tear  Patient has both clinical evidence as well as ultrasound evidence of tendon healing. I want him to continue on his topical nitroglycerin and continue with his eccentric exercises. He can continue with cryotherapy as well. Followup with me in 4 weeks for reevaluation and repeat ultrasound.

## 2014-08-10 ENCOUNTER — Ambulatory Visit: Payer: 59 | Admitting: Sports Medicine

## 2014-09-13 ENCOUNTER — Ambulatory Visit (INDEPENDENT_AMBULATORY_CARE_PROVIDER_SITE_OTHER): Payer: 59 | Admitting: Sports Medicine

## 2014-09-13 ENCOUNTER — Encounter: Payer: Self-pay | Admitting: Sports Medicine

## 2014-09-13 VITALS — BP 110/81 | Ht 69.0 in | Wt 163.0 lb

## 2014-09-13 DIAGNOSIS — M7711 Lateral epicondylitis, right elbow: Secondary | ICD-10-CM

## 2014-09-13 DIAGNOSIS — M7710 Lateral epicondylitis, unspecified elbow: Secondary | ICD-10-CM

## 2014-09-13 NOTE — Progress Notes (Signed)
CC: follow up right elbow lateral epicondylitis  HPI: 29 yom presenting for routine follow up of right elbow lateral epicondylitis. Last seen 08/09/14 per chart review.  Overall he states he is doing well.  He has returned to golf, able to do this with minimal pain or exacerbation of previous symptoms.   Has been compliant with home exercises and has been using nitro patch daily.  Nitro patch has been used for approximately 2 months at this point.  Has not needed OTC medications for pain control/relief.  Physical exam: BP 110/81  Ht 5\' 9"  (1.753 m)  Wt 163 lb (73.936 kg)  BMI 24.06 kg/m2 Gen: NAD HEENT: mmm CV: nl perfusion Resp: no increased wob Msk:  No effusion, no soft tissue swelling, no ecchymosis or edema. Minimal ttp over lateral epicondyle. 5/5 grip strength, 5/5 wrist flexion/extension, 5/5 ulnar/radial deviation.  Some reproducible pain with resisted wrist extension. NV intact.  Imaging: MSK ultrasound performed by Dr. Micheline Chapman Imaging at the lateral epicondyle revealed healing/healed partial tear of common extensor tendon.  Previously noted hypoechoic area resolved.  A/P: 31 yom presenting for routine follow up of lateral epicondylitis.  Overall this has improved and patient has returned to his normal routine with minimal symptoms.  Advised to continue using nitro patch per protocol for additional 1 month.  Patient is free to contact our office if any further questions or issues arise, however given his improvement at this point likely will not require additional follow up for lateral epicondylitis.    Note composed by Malka So, MD.  Patient seen and examined with Dr. Micheline Chapman

## 2016-04-13 ENCOUNTER — Encounter: Payer: Self-pay | Admitting: Sports Medicine

## 2016-04-13 ENCOUNTER — Ambulatory Visit (INDEPENDENT_AMBULATORY_CARE_PROVIDER_SITE_OTHER): Payer: 59 | Admitting: Sports Medicine

## 2016-04-13 ENCOUNTER — Ambulatory Visit
Admission: RE | Admit: 2016-04-13 | Discharge: 2016-04-13 | Disposition: A | Discharge status: Home / Self Care | Payer: 59 | Source: Ambulatory Visit | Attending: Sports Medicine | Admitting: Sports Medicine

## 2016-04-13 VITALS — BP 122/77 | Ht 69.0 in | Wt 165.0 lb

## 2016-04-13 DIAGNOSIS — M25552 Pain in left hip: Secondary | ICD-10-CM

## 2016-04-13 MED ORDER — NITROGLYCERIN 0.2 MG/HR TD PT24: MEDICATED_PATCH | TRANSDERMAL | Status: DC

## 2016-04-16 NOTE — Progress Notes (Signed)
Lucas Mills - 50 y.o. male MRN [id number]  Date of birth: 1966-04-09  CC: Left lateral elbow pain and left hip pain.    SUBJECTIVE:   HPI Lucas Mills is a very pleasant 50 yo male with left elbow pain x 6 weeks and left hip pain for several months.  Previously had dealt with tennis elbow b/l, but is doing better with those issues. For the last 6 weeks he has had a persistent lateral anterior elbow pain.  Avid weight lifter and he has pain with any type of elbow flexion exercise.  He is able to modify the technique to lift without pain.  No acute injury. Pain never resolves completely. Never felt a pop or other acute injury.   His hip is more bothersome.  No acute injury. No swelling.  Able to do any activities including running and LE weightlifting.  Pain with internal and external rotation of the hip.  Otherwise he is feeling well.  No night time pain.  He has seen a chiropractor and attempted to rehab his hip on his own without improvement.  Hip also bothers him during intercourse.    ROS:     As above, no fevers, chills, or night sweats.  No rash or joint aches and pain.   HISTORY: Past Medical, Surgical, Social, and Family History Reviewed & Updated per EMR.  Pertinent Historical Findings include: Hypothyroidism, Headache  OBJECTIVE: BP 122/77 mmHg  Ht 5\' 9"  (1.753 m)  Wt 165 lb (74.844 kg)  BMI 24.36 kg/m2  Physical Exam  Calm, NAD Non-labored breathing.  Elbow (Left): TTP over BR muscle belly>biceps insertion. No pain with resisted wrist flexion/extension.  Pain with resisted elbow flexion. No pain with resisted supination/pronation.    Hip: Left ROM IR: 70 Deg with terminal pain, ER: 21 Deg with terminal pain, Flexion: 120 Deg, Extension: 100 Deg, Abduction: 45 Deg, Adduction: 45 Deg Strength IR: 5/5, ER: 5/5, Flexion: 5/5, Extension: 5/5, Abduction: 5/5, Adduction: 5/5 Pelvic alignment unremarkable to inspection and palpation. Greater trochanter without tenderness to  palpation. No SI joint tenderness and normal minimal SI movement.   MEDICATIONS, LABS & OTHER ORDERS: Previous Medications   CYCLOBENZAPRINE (FLEXERIL) 10 MG TABLET    Take 1 tablet (10 mg total) by mouth 3 (three) times daily as needed for muscle spasms.   LEVOTHYROXINE (SYNTHROID, LEVOTHROID) 112 MCG TABLET    Take 112 mcg by mouth daily.   NAPROXEN (NAPROSYN) 500 MG TABLET    Take 1 tablet (500 mg total) by mouth 2 (two) times daily with a meal.   OXYCODONE-ACETAMINOPHEN (PERCOCET) 5-325 MG PER TABLET    Take 1-2 tablets by mouth every 4 (four) hours as needed for pain.   TEMAZEPAM (RESTORIL) 30 MG CAPSULE    Take 1 capsule (30 mg total) by mouth at bedtime as needed for sleep.   Modified Medications   Modified Medication Previous Medication   NITROGLYCERIN (NITRODUR - DOSED IN MG/24 HR) 0.2 MG/HR PATCH nitroGLYCERIN (NITRODUR - DOSED IN MG/24 HR) 0.2 mg/hr patch      Use 1/4 patch to the affected area    Use 1/4 patch to the affected area   New Prescriptions   No medications on file   Discontinued Medications   No medications on file   Orders Placed This Encounter  Procedures  . DG HIP INFANT UNILAT W OR W/O PELVIS 1V LEFT  . DG HIP UNILAT WITH PELVIS 2-3 VIEWS LEFT  . MR Hip Left W Contrast  .  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 No Soft tissue weakness or tenderness. Pain with terminal internal/external rotation. No injury. Able to do all exercises without discomfort.  We will get an XR.  If it is negative we will order an MRArthrogram with diagnostic injection. The origin of his pain does seem to originating from his joint.  No systemic symptoms.   In regards to his elbow pain, this seems much more muscular in nature.  There may be a slight tear in the distal biceps, however he seems to have BR strain that has been slow to improve.  He has used  nitro in the past and we have asked him to restart this.  Also asked to begin eccentric weightlifting exercises.    F/u 6 weeks.

## 2016-04-25 ENCOUNTER — Other Ambulatory Visit: Payer: 59

## 2016-05-09 ENCOUNTER — Ambulatory Visit (INDEPENDENT_AMBULATORY_CARE_PROVIDER_SITE_OTHER): Payer: 59 | Admitting: Sports Medicine

## 2016-05-09 ENCOUNTER — Encounter: Payer: Self-pay | Admitting: Sports Medicine

## 2016-05-09 VITALS — BP 119/80 | HR 70 | Ht 69.0 in | Wt 165.0 lb

## 2016-05-09 DIAGNOSIS — M76892 Other specified enthesopathies of left lower limb, excluding foot: Secondary | ICD-10-CM

## 2016-05-09 DIAGNOSIS — M25752 Osteophyte, left hip: Secondary | ICD-10-CM

## 2016-05-09 DIAGNOSIS — M25852 Other specified joint disorders, left hip: Principal | ICD-10-CM

## 2016-05-09 DIAGNOSIS — M25522 Pain in left elbow: Secondary | ICD-10-CM | POA: Diagnosis not present

## 2016-05-09 NOTE — Progress Notes (Signed)
Lucas Mills - 50 y.o. male MRN [id number]  Date of birth: 1966-01-07  CC: Left lateral elbow pain and left hip pain.    SUBJECTIVE:   HPI Lucas Mills is a very pleasant 50 yo male here for follow-up of with left elbow pain and left hip pain.  He was last seen on 04/13/2016 for these issues.  - Previously had dealt with tennis elbow b/l, but is doing better with those issues. 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 He denies any acute injury. Since last visit he has been using nitroglycerin patches and massage as well as icing. He is also wearing a body helix compression sleeve. He is slightly better at this time. He is starting to do full arm extension tricep exercises which are recommended by his chiropractor in addition to the eccentric exercises.   - In regards to his left hip he denies any improvement or worsening. He had an x-ray which shows a possible early pincer deformity. We did order MR arthrogram, but he was unable to get this done due to transportation issues. He is able to do any activities including running and LE weightlifting. He even played hockey 3 times last week for the first time in 30 years and had no discomfort or plane.  He mostly feels the pain with frog legging of the hips. Otherwise he is feeling well.  No night time pain.   Hip also bothers him during intercourse.    ROS:     As above, no fevers, chills, or night sweats.  No rash or joint aches and pain.   HISTORY: Past Medical, Surgical, Social, and Family History Reviewed & Updated per EMR.  Pertinent Historical Findings include: Hypothyroidism, Headache  Imaging reviewed: No acute bony abnormality. Low-grade pincer deformity identified.  OBJECTIVE: BP 119/80 mmHg  Pulse 70  Ht 5\' 9"  (1.753 m)  Wt 165 lb (74.844 kg)  BMI 24.36 kg/m2  Physical Exam  Calm, NAD Non-labored breathing.  Elbow (Left): TTP over BR/lateral brachialis muscle belly. No pain with resisted wrist  flexion/extension.  Pain with resisted elbow flexion with wrist held in a transverse plane versus a sagittal plane. No pain with resisted supination/pronation.  Negative hook test  Hip: Left ROM IR: 70 Deg with terminal pain, ER: 16 Deg with terminal pain, Flexion: 120 Deg, Extension: 100 Deg, Abduction: 45 Deg, Adduction: 45 Deg Strength IR: 5/5, ER: 5/5, Flexion: 5/5, Extension: 5/5, Abduction: 5/5, Adduction: 5/5 Positive FADIR on the right.  Limitation in FABER, minimal.  Pelvic alignment unremarkable to inspection and palpation. Greater trochanter without tenderness to palpation. No SI joint tenderness and normal minimal SI movement.     MEDICATIONS, LABS & OTHER ORDERS: Previous Medications   CYCLOBENZAPRINE (FLEXERIL) 10 MG TABLET    Take 1 tablet (10 mg total) by mouth 3 (three) times daily as needed for muscle spasms.   LEVOTHYROXINE (SYNTHROID, LEVOTHROID) 112 MCG TABLET    Take 112 mcg by mouth daily.   NAPROXEN (NAPROSYN) 500 MG TABLET    Take 1 tablet (500 mg total) by mouth 2 (two) times daily with a meal.   NITROGLYCERIN (NITRODUR - DOSED IN MG/24 HR) 0.2 MG/HR PATCH    Use 1/4 patch to the affected area   OXYCODONE-ACETAMINOPHEN (PERCOCET) 5-325 MG PER TABLET    Take 1-2 tablets by mouth every 4 (four) hours as needed for pain.   TEMAZEPAM (RESTORIL) 30 MG CAPSULE    Take 1 capsule (30  mg total) by mouth at bedtime as needed for sleep.   Modified Medications   No medications on file   New Prescriptions   No medications on file   Discontinued Medications   No medications on file   No orders of the defined types were placed in this encounter.   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 Suggestion of a pincer deformity was found on x-ray. Again he is doing relatively well and is able to play hockey with no discomfort. He only has pain in certain positions. While we did order an MR arthrogram at the  last visit he was able to get this done. At this time he will contemplate whether he needs this or not. Certainly if he does not want to have surgery may not make sense to have this study done. He will call back with any questions.  In regards to his anterior elbow pain he has a low-grade strain of what appears to be the brachialis versus brachial radialis distal muscle belly.  He has been doing nitroglycerin patches and a body helix compression sleeve as well as icing the area. He will continue these modalities. He will also continue seeing his chiropractor for deep tissue massage. He should continue to get gradual improvement which may be slowed if he continues to aggravate the area with weight lifting or hockey. He will call back with any questions or concerns but is free to continue these therapeutic options as needed.

## 2017-05-10 ENCOUNTER — Encounter: Payer: Self-pay | Admitting: Family Medicine

## 2017-05-10 ENCOUNTER — Ambulatory Visit (INDEPENDENT_AMBULATORY_CARE_PROVIDER_SITE_OTHER): Payer: 59 | Admitting: Family Medicine

## 2017-05-10 VITALS — BP 122/84 | Ht 69.0 in | Wt 165.0 lb

## 2017-05-10 DIAGNOSIS — M25562 Pain in left knee: Secondary | ICD-10-CM

## 2017-05-13 DIAGNOSIS — M25562 Pain in left knee: Secondary | ICD-10-CM | POA: Insufficient documentation

## 2017-05-13 NOTE — Assessment & Plan Note (Signed)
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 Doesn't demonstrate any mechanical symptoms currently.  - encourage compression  - NSAIDS PRN  - ICE PRN  - f/u PRN. If no improvement. Consider x-ray. Discussed possible steroid injection but he says this interferes with his thyroid so would like to stay away from them. Could try PRP injections

## 2017-05-13 NOTE — Progress Notes (Signed)
  Lucas Mills - 51 y.o. male MRN [id number]  Date of birth: Aug 05, 1966  SUBJECTIVE:  Including CC & ROS.   [name] is a 51 yo M that is presenting with left knee pain. He reports the pain has been present since last October. He was able to control it with ice and advil. He was playing tennis about 2 to 3 weeks ago and felt pain after he twisted it. This pain is on the medial aspect of his knee. He has obtained a body helix and that seems to help. He denies any prior injury to his knee. Has not had any surgery on his knee. His knee is not locking. Does have some loss of flexion. Feels like he has some fluid behind his knee.   ROS: No unexpected weight loss, fever, chills, swelling, instability, muscle pain, numbness/tingling, redness, otherwise see HPI   HISTORY: Past Medical, Surgical, Social, and Family History Reviewed & Updated per EMR.   Pertinent Historical Findings include: PMHx: hypothryoidism Surgical:   Left shoulder surgery  Social:  No tobacco use, occasional alcohol use  FHx: none  DATA REVIEWED: none  PHYSICAL EXAM:  VS: BP 122/84   Ht 5\' 9"  (1.753 m)   Wt 165 lb (74.8 kg)   BMI 24.37 kg/m  PHYSICAL EXAM: Gen: NAD, alert, cooperative with exam, well-appearing HEENT: clear conjunctiva, EOMI CV:  no edema, capillary refill brisk,  Resp: non-labored, normal speech Skin: no rashes, normal turgor  Neuro: no gross deficits.  Psych:  alert and oriented MSK:  Left Knee: Normal to inspection with no erythema or effusion or obvious bony abnormalities. Palpation normal with no warmth, patellar tenderness, or condyle tenderness. Some medial joint line tenderness  ROM full in flexion and extension and lower leg rotation. Ligaments with solid consistent endpoints including ACL, LCL, MCL. Negative Thessalonian tests. Non painful patellar compression. Patellar glide without crepitus. Patellar and quadriceps tendons unremarkable. Hamstring and quadriceps strength is normal.    Neurovascularly intact  Limited ultrasound: left knee:  No obvious effusion in SPP  QT and PT are normal  Lateral meniscus has slight tear in lateral horn  Medial joint line has some narrowing  Medial meniscus has some slight outpouching.  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 Doesn't demonstrate any mechanical symptoms currently.  - encourage compression  - NSAIDS PRN  - ICE PRN  - f/u PRN. If no improvement. Consider x-ray. Discussed possible steroid injection but he says this interferes with his thyroid so would like to stay away from them. Could try PRP injections

## 2017-05-28 ENCOUNTER — Other Ambulatory Visit: Payer: Self-pay | Admitting: *Deleted

## 2017-05-28 DIAGNOSIS — M25562 Pain in left knee: Secondary | ICD-10-CM

## 2017-05-29 ENCOUNTER — Ambulatory Visit
Admission: RE | Admit: 2017-05-29 | Discharge: 2017-05-29 | Disposition: A | Discharge status: Home / Self Care | Payer: 59 | Source: Ambulatory Visit | Attending: Sports Medicine | Admitting: Sports Medicine

## 2017-05-29 ENCOUNTER — Telehealth: Payer: Self-pay | Admitting: Sports Medicine

## 2017-05-29 DIAGNOSIS — M25562 Pain in left knee: Secondary | ICD-10-CM

## 2017-05-29 DIAGNOSIS — Z0000 Encounter for general adult medical examination without abnormal findings: Secondary | ICD-10-CM | POA: Diagnosis not present

## 2017-05-29 DIAGNOSIS — Z1159 Encounter for screening for other viral diseases: Secondary | ICD-10-CM | POA: Diagnosis not present

## 2017-05-29 DIAGNOSIS — M7989 Other specified soft tissue disorders: Secondary | ICD-10-CM | POA: Diagnosis not present

## 2017-05-29 DIAGNOSIS — Z23 Encounter for immunization: Secondary | ICD-10-CM | POA: Diagnosis not present

## 2017-05-29 NOTE — Telephone Encounter (Signed)
  I spoke with Lucas Mills on the phone today after reviewing x-rays of his left knee. I ordered the x-rays after he called the office stating he was still having knee pain. His x-rays show very little in the way of arthritis. He is complaining of some occasional catching in the knee but admits that that has improved dramatically over the past 48 hours while using more ice. He enjoys playing hockey as well as golf. He has no pain with hockey but has quite a bit of pain with golf. I recommended that he stop golfing for the next couple of weeks, continue icing, and start daily isometric quad exercises. If his symptoms persist or worsen despite this, then we could consider the merits of further diagnostic imaging. We also discussed a possible cortisone injection but he has had problems with his thyroid in the past when using a steroid nasal spray. He will follow-up for ongoing or recalcitrant issues.

## 2017-06-11 DIAGNOSIS — Z800 Family history of malignant neoplasm of digestive organs: Secondary | ICD-10-CM | POA: Diagnosis not present

## 2017-06-11 DIAGNOSIS — Z8371 Family history of colonic polyps: Secondary | ICD-10-CM | POA: Diagnosis not present

## 2017-09-17 DIAGNOSIS — M25562 Pain in left knee: Secondary | ICD-10-CM | POA: Diagnosis not present

## 2017-09-23 DIAGNOSIS — M25562 Pain in left knee: Secondary | ICD-10-CM | POA: Diagnosis not present

## 2017-09-24 DIAGNOSIS — S83242A Other tear of medial meniscus, current injury, left knee, initial encounter: Secondary | ICD-10-CM | POA: Diagnosis not present

## 2017-10-15 DIAGNOSIS — M25562 Pain in left knee: Secondary | ICD-10-CM | POA: Diagnosis not present

## 2017-12-06 DIAGNOSIS — M23222 Derangement of posterior horn of medial meniscus due to old tear or injury, left knee: Secondary | ICD-10-CM | POA: Diagnosis not present

## 2017-12-06 DIAGNOSIS — S83242A Other tear of medial meniscus, current injury, left knee, initial encounter: Secondary | ICD-10-CM | POA: Diagnosis not present

## 2017-12-06 DIAGNOSIS — G8918 Other acute postprocedural pain: Secondary | ICD-10-CM | POA: Diagnosis not present

## 2017-12-10 DIAGNOSIS — M23322 Other meniscus derangements, posterior horn of medial meniscus, left knee: Secondary | ICD-10-CM | POA: Diagnosis not present

## 2017-12-10 DIAGNOSIS — M25562 Pain in left knee: Secondary | ICD-10-CM | POA: Diagnosis not present

## 2018-01-06 DIAGNOSIS — Z1211 Encounter for screening for malignant neoplasm of colon: Secondary | ICD-10-CM | POA: Diagnosis not present

## 2018-01-06 DIAGNOSIS — Z8371 Family history of colonic polyps: Secondary | ICD-10-CM | POA: Diagnosis not present

## 2018-01-06 DIAGNOSIS — K635 Polyp of colon: Secondary | ICD-10-CM | POA: Diagnosis not present

## 2018-01-27 DIAGNOSIS — L570 Actinic keratosis: Secondary | ICD-10-CM | POA: Diagnosis not present

## 2018-01-27 DIAGNOSIS — Z85828 Personal history of other malignant neoplasm of skin: Secondary | ICD-10-CM | POA: Diagnosis not present

## 2018-01-27 DIAGNOSIS — L821 Other seborrheic keratosis: Secondary | ICD-10-CM | POA: Diagnosis not present

## 2018-03-02 DIAGNOSIS — J209 Acute bronchitis, unspecified: Secondary | ICD-10-CM | POA: Diagnosis not present

## 2018-03-02 DIAGNOSIS — M79641 Pain in right hand: Secondary | ICD-10-CM | POA: Diagnosis not present

## 2018-03-11 DIAGNOSIS — M79641 Pain in right hand: Secondary | ICD-10-CM | POA: Diagnosis not present

## 2018-03-26 DIAGNOSIS — M7542 Impingement syndrome of left shoulder: Secondary | ICD-10-CM | POA: Diagnosis not present

## 2018-03-26 DIAGNOSIS — M25512 Pain in left shoulder: Secondary | ICD-10-CM | POA: Diagnosis not present

## 2018-04-07 DIAGNOSIS — S46911A Strain of unspecified muscle, fascia and tendon at shoulder and upper arm level, right arm, initial encounter: Secondary | ICD-10-CM | POA: Diagnosis not present

## 2018-04-07 DIAGNOSIS — M25511 Pain in right shoulder: Secondary | ICD-10-CM | POA: Diagnosis not present

## 2018-04-24 DIAGNOSIS — M25511 Pain in right shoulder: Secondary | ICD-10-CM | POA: Diagnosis not present

## 2018-04-28 DIAGNOSIS — S43431D Superior glenoid labrum lesion of right shoulder, subsequent encounter: Secondary | ICD-10-CM | POA: Diagnosis not present

## 2018-05-16 DIAGNOSIS — G8918 Other acute postprocedural pain: Secondary | ICD-10-CM | POA: Diagnosis not present

## 2018-05-16 DIAGNOSIS — M19011 Primary osteoarthritis, right shoulder: Secondary | ICD-10-CM | POA: Diagnosis not present

## 2018-05-16 DIAGNOSIS — S43431D Superior glenoid labrum lesion of right shoulder, subsequent encounter: Secondary | ICD-10-CM | POA: Diagnosis not present

## 2018-05-16 DIAGNOSIS — R600 Localized edema: Secondary | ICD-10-CM | POA: Diagnosis not present

## 2018-05-16 DIAGNOSIS — S43431A Superior glenoid labrum lesion of right shoulder, initial encounter: Secondary | ICD-10-CM | POA: Diagnosis not present

## 2018-05-16 DIAGNOSIS — M7501 Adhesive capsulitis of right shoulder: Secondary | ICD-10-CM | POA: Diagnosis not present

## 2018-05-16 DIAGNOSIS — M94211 Chondromalacia, right shoulder: Secondary | ICD-10-CM | POA: Diagnosis not present

## 2018-05-20 DIAGNOSIS — S43431D Superior glenoid labrum lesion of right shoulder, subsequent encounter: Secondary | ICD-10-CM | POA: Diagnosis not present

## 2018-05-20 DIAGNOSIS — M7501 Adhesive capsulitis of right shoulder: Secondary | ICD-10-CM | POA: Diagnosis not present

## 2018-05-20 DIAGNOSIS — M19011 Primary osteoarthritis, right shoulder: Secondary | ICD-10-CM | POA: Diagnosis not present

## 2018-05-23 DIAGNOSIS — M25511 Pain in right shoulder: Secondary | ICD-10-CM | POA: Diagnosis not present

## 2018-05-26 DIAGNOSIS — M25511 Pain in right shoulder: Secondary | ICD-10-CM | POA: Diagnosis not present

## 2018-05-28 DIAGNOSIS — M25511 Pain in right shoulder: Secondary | ICD-10-CM | POA: Diagnosis not present

## 2018-05-30 DIAGNOSIS — M25511 Pain in right shoulder: Secondary | ICD-10-CM | POA: Diagnosis not present

## 2018-06-02 DIAGNOSIS — M25511 Pain in right shoulder: Secondary | ICD-10-CM | POA: Diagnosis not present

## 2018-06-04 DIAGNOSIS — M25511 Pain in right shoulder: Secondary | ICD-10-CM | POA: Diagnosis not present

## 2018-06-04 DIAGNOSIS — Z0000 Encounter for general adult medical examination without abnormal findings: Secondary | ICD-10-CM | POA: Diagnosis not present

## 2018-06-04 DIAGNOSIS — E039 Hypothyroidism, unspecified: Secondary | ICD-10-CM | POA: Diagnosis not present

## 2018-06-04 DIAGNOSIS — E782 Mixed hyperlipidemia: Secondary | ICD-10-CM | POA: Diagnosis not present

## 2018-06-06 DIAGNOSIS — M25511 Pain in right shoulder: Secondary | ICD-10-CM | POA: Diagnosis not present

## 2018-06-09 DIAGNOSIS — M25511 Pain in right shoulder: Secondary | ICD-10-CM | POA: Diagnosis not present

## 2018-06-11 DIAGNOSIS — M25511 Pain in right shoulder: Secondary | ICD-10-CM | POA: Diagnosis not present

## 2018-06-13 DIAGNOSIS — M25511 Pain in right shoulder: Secondary | ICD-10-CM | POA: Diagnosis not present

## 2018-06-14 DIAGNOSIS — M19011 Primary osteoarthritis, right shoulder: Secondary | ICD-10-CM | POA: Diagnosis not present

## 2018-06-14 DIAGNOSIS — S43431D Superior glenoid labrum lesion of right shoulder, subsequent encounter: Secondary | ICD-10-CM | POA: Diagnosis not present

## 2018-06-14 DIAGNOSIS — R600 Localized edema: Secondary | ICD-10-CM | POA: Diagnosis not present

## 2018-06-17 DIAGNOSIS — M25511 Pain in right shoulder: Secondary | ICD-10-CM | POA: Diagnosis not present

## 2018-06-24 DIAGNOSIS — M25511 Pain in right shoulder: Secondary | ICD-10-CM | POA: Diagnosis not present

## 2018-06-27 DIAGNOSIS — M25511 Pain in right shoulder: Secondary | ICD-10-CM | POA: Diagnosis not present

## 2018-06-30 DIAGNOSIS — M25511 Pain in right shoulder: Secondary | ICD-10-CM | POA: Diagnosis not present

## 2018-07-02 DIAGNOSIS — M25511 Pain in right shoulder: Secondary | ICD-10-CM | POA: Diagnosis not present

## 2018-07-07 DIAGNOSIS — M25511 Pain in right shoulder: Secondary | ICD-10-CM | POA: Diagnosis not present

## 2018-07-09 DIAGNOSIS — M25511 Pain in right shoulder: Secondary | ICD-10-CM | POA: Diagnosis not present

## 2018-07-11 DIAGNOSIS — M25511 Pain in right shoulder: Secondary | ICD-10-CM | POA: Diagnosis not present

## 2018-07-14 DIAGNOSIS — M25511 Pain in right shoulder: Secondary | ICD-10-CM | POA: Diagnosis not present

## 2018-07-30 DIAGNOSIS — M25511 Pain in right shoulder: Secondary | ICD-10-CM | POA: Diagnosis not present

## 2018-08-01 DIAGNOSIS — M25511 Pain in right shoulder: Secondary | ICD-10-CM | POA: Diagnosis not present

## 2018-08-04 DIAGNOSIS — M25511 Pain in right shoulder: Secondary | ICD-10-CM | POA: Diagnosis not present

## 2018-08-07 DIAGNOSIS — M25511 Pain in right shoulder: Secondary | ICD-10-CM | POA: Diagnosis not present

## 2018-08-11 DIAGNOSIS — M25511 Pain in right shoulder: Secondary | ICD-10-CM | POA: Diagnosis not present

## 2018-08-14 DIAGNOSIS — M25511 Pain in right shoulder: Secondary | ICD-10-CM | POA: Diagnosis not present

## 2018-08-18 DIAGNOSIS — E039 Hypothyroidism, unspecified: Secondary | ICD-10-CM | POA: Diagnosis not present

## 2018-08-19 DIAGNOSIS — M25511 Pain in right shoulder: Secondary | ICD-10-CM | POA: Diagnosis not present

## 2018-08-21 DIAGNOSIS — M25511 Pain in right shoulder: Secondary | ICD-10-CM | POA: Diagnosis not present

## 2018-08-28 DIAGNOSIS — M25511 Pain in right shoulder: Secondary | ICD-10-CM | POA: Diagnosis not present

## 2018-09-01 DIAGNOSIS — M25511 Pain in right shoulder: Secondary | ICD-10-CM | POA: Diagnosis not present

## 2018-09-04 DIAGNOSIS — M25511 Pain in right shoulder: Secondary | ICD-10-CM | POA: Diagnosis not present

## 2018-09-08 DIAGNOSIS — M25511 Pain in right shoulder: Secondary | ICD-10-CM | POA: Diagnosis not present

## 2018-09-11 DIAGNOSIS — M25511 Pain in right shoulder: Secondary | ICD-10-CM | POA: Diagnosis not present

## 2018-09-15 DIAGNOSIS — M25511 Pain in right shoulder: Secondary | ICD-10-CM | POA: Diagnosis not present

## 2018-09-18 DIAGNOSIS — M25511 Pain in right shoulder: Secondary | ICD-10-CM | POA: Diagnosis not present

## 2018-09-23 DIAGNOSIS — M25511 Pain in right shoulder: Secondary | ICD-10-CM | POA: Diagnosis not present

## 2018-09-29 DIAGNOSIS — M25511 Pain in right shoulder: Secondary | ICD-10-CM | POA: Diagnosis not present

## 2018-10-07 DIAGNOSIS — M25511 Pain in right shoulder: Secondary | ICD-10-CM | POA: Diagnosis not present

## 2018-12-25 DIAGNOSIS — H1089 Other conjunctivitis: Secondary | ICD-10-CM | POA: Diagnosis not present

## 2019-02-09 DIAGNOSIS — J069 Acute upper respiratory infection, unspecified: Secondary | ICD-10-CM | POA: Diagnosis not present

## 2021-07-28 ENCOUNTER — Other Ambulatory Visit: Payer: Self-pay | Admitting: Orthopedic Surgery

## 2021-07-28 DIAGNOSIS — M19011 Primary osteoarthritis, right shoulder: Secondary | ICD-10-CM

## 2021-08-03 ENCOUNTER — Ambulatory Visit
Admission: RE | Admit: 2021-08-03 | Discharge: 2021-08-03 | Disposition: A | Discharge status: Home / Self Care | Payer: 59 | Source: Ambulatory Visit | Attending: Internal Medicine | Admitting: Internal Medicine

## 2021-08-03 ENCOUNTER — Other Ambulatory Visit: Payer: Self-pay | Admitting: Internal Medicine

## 2021-08-03 ENCOUNTER — Other Ambulatory Visit: Payer: Self-pay

## 2021-08-03 DIAGNOSIS — R059 Cough, unspecified: Secondary | ICD-10-CM

## 2021-08-22 ENCOUNTER — Ambulatory Visit
Admission: RE | Admit: 2021-08-22 | Discharge: 2021-08-22 | Disposition: A | Discharge status: Home / Self Care | Payer: 59 | Source: Ambulatory Visit | Attending: Orthopedic Surgery | Admitting: Orthopedic Surgery

## 2021-08-22 ENCOUNTER — Other Ambulatory Visit: Payer: Self-pay

## 2021-08-22 DIAGNOSIS — M19011 Primary osteoarthritis, right shoulder: Secondary | ICD-10-CM

## 2021-10-02 NOTE — Patient Instructions (Addendum)
DUE TO COVID-19 ONLY ONE VISITOR IS ALLOWED TO COME WITH YOU AND STAY IN THE WAITING ROOM ONLY DURING PRE OP AND PROCEDURE.   **NO VISITORS ARE ALLOWED IN THE SHORT STAY AREA OR RECOVERY ROOM!!**       Your procedure is scheduled on: 10/12/21   Report to [facility] Main Entrance    Report to admitting at 7:15 AM   Call this number if you have problems the morning of surgery [phone]   Do not eat food :After Midnight.   May have liquids until 7:00 AM day of surgery  CLEAR LIQUID DIET  Foods Allowed                                                                     Foods Excluded  Water, Black Coffee and tea (no milk or creamer)          liquids that you cannot  Plain Jell-O in any flavor  (No red)                                   see through such as: Fruit ices (not with fruit pulp)                                           milk, soups, orange juice              Iced Popsicles (No red)                                               All solid food                                   Apple juices Sports drinks like Gatorade (No red) Lightly seasoned clear broth or consume(fat free) Sugar     The day of surgery:  Drink ONE (1) Pre-Surgery Clear Ensure by 7:00 am the morning of surgery. Drink in one sitting. Do not sip.  This drink was given to you during your hospital  pre-op appointment visit. Nothing else to drink after completing the  Pre-Surgery Clear Ensure.          If you have questions, please contact your surgeon's office.     Oral Hygiene is also important to reduce your risk of infection.                                    Remember - BRUSH YOUR TEETH THE MORNING OF SURGERY WITH YOUR REGULAR TOOTHPASTE   Take these medicines the morning of surgery with A SIP OF WATER: Levothyroxine, Gabapentin                              You may not have  any metal on your body including jewelry, and body piercing             Do not wear lotions, powders, cologne, or  deodorant              Men may shave face and neck.   Do not bring valuables to the hospital. [place].    Patients discharged on the day of surgery will not be allowed to drive home.  Special Instructions: Bring a copy of your healthcare power of attorney and living will documents         the day of surgery if you haven't scanned them before.  Please read over the following fact sheets you were given: IF YOU HAVE QUESTIONS ABOUT YOUR PRE-OP INSTRUCTIONS PLEASE CALL [facility] - Preparing for Surgery Before surgery, you can play an important role.  Because skin is not sterile, your skin needs to be as free of germs as possible.  You can reduce the number of germs on your skin by washing with CHG (chlorahexidine gluconate) soap before surgery.  CHG is an antiseptic cleaner which kills germs and bonds with the skin to continue killing germs even after washing. Please DO NOT use if you have an allergy to CHG or antibacterial soaps.  If your skin becomes reddened/irritated stop using the CHG and inform your nurse when you arrive at Short Stay. Do not shave (including legs and underarms) for at least 48 hours prior to the first CHG shower.  You may shave your face/neck.  Please follow these instructions carefully:  1.  Shower with CHG Soap the night before surgery and the  morning of surgery.  2.  If you choose to wash your hair, wash your hair first as usual with your normal  shampoo.  3.  After you shampoo, rinse your hair and body thoroughly to remove the shampoo.                             4.  Use CHG as you would any other liquid soap.  You can apply chg directly to the skin and wash.  Gently with a scrungie or clean washcloth.  5.  Apply the CHG Soap to your body ONLY FROM THE NECK DOWN.   Do   not use on face/ open                           Wound or open sores. Avoid contact with eyes, ears mouth and   genitals  (private parts).                       Wash face,  Genitals (private parts) with your normal soap.             6.  Wash thoroughly, paying special attention to the area where your    surgery  will be performed.  7.  Thoroughly rinse your body with warm water from the neck down.  8.  DO NOT shower/wash with your normal soap after using and rinsing off the CHG Soap.                9.  Pat yourself dry with a clean towel.  10.  Wear clean pajamas.            11.  Place clean sheets on your bed the night of your first shower and do not  sleep with pets. Day of Surgery : Do not apply any lotions/deodorants the morning of surgery.  Please wear clean clothes to the hospital/surgery center.  FAILURE TO FOLLOW THESE INSTRUCTIONS MAY RESULT IN THE CANCELLATION OF YOUR SURGERY  PATIENT SIGNATURE_________________________________  NURSE SIGNATURE__________________________________  ________________________________________________________________________   Adam Phenix  An incentive spirometer is a tool that can help keep your lungs clear and active. This tool measures how well you are filling your lungs with each breath. Taking long deep breaths may help reverse or decrease the chance of developing breathing (pulmonary) problems (especially infection) following: A long period of time when you are unable to move or be active. BEFORE THE PROCEDURE  If the spirometer includes an indicator to show your best effort, your nurse or respiratory therapist will set it to a desired goal. If possible, sit up straight or lean slightly forward. Try not to slouch. Hold the incentive spirometer in an upright position. INSTRUCTIONS FOR USE  Sit on the edge of your bed if possible, or sit up as far as you can in bed or on a chair. Hold the incentive spirometer in an upright position. Breathe out normally. Place the mouthpiece in your mouth and seal your lips tightly around it. Breathe in slowly and  as deeply as possible, raising the piston or the ball toward the top of the column. Hold your breath for 3-5 seconds or for as long as possible. Allow the piston or ball to fall to the bottom of the column. Remove the mouthpiece from your mouth and breathe out normally. Rest for a few seconds and repeat Steps 1 through 7 at least 10 times every 1-2 hours when you are awake. Take your time and take a few normal breaths between deep breaths. The spirometer may include an indicator to show your best effort. Use the indicator as a goal to work toward during each repetition. After each set of 10 deep breaths, practice coughing to be sure your lungs are clear. If you have an incision (the cut made at the time of surgery), support your incision when coughing by placing a pillow or rolled up towels firmly against it. Once you are able to get out of bed, walk around indoors and cough well. You may stop using the incentive spirometer when instructed by your caregiver.  RISKS AND COMPLICATIONS Take your time so you do not get dizzy or light-headed. If you are in pain, you may need to take or ask for pain medication before doing incentive spirometry. It is harder to take a deep breath if you are having pain. AFTER USE Rest and breathe slowly and easily. It can be helpful to keep track of a log of your progress. Your caregiver can provide you with a simple table to help with this. If you are using the spirometer at home, follow these instructions: [place] IF:  You are having difficultly using the spirometer. You have trouble using the spirometer as often as instructed. Your pain medication is not giving enough relief while using the spirometer. You develop fever of 100.5 F (38.1 C) or higher. SEEK IMMEDIATE MEDICAL CARE IF:  You cough up bloody sputum that had not been present before. You develop fever of 102 F (38.9 C) or greater. You develop worsening pain at or  near the incision site. MAKE  SURE YOU:  Understand these instructions. Will watch your condition. Will get help right away if you are not doing well or get worse. Document Released: 04/22/2007 Document Revised: 03/03/2012 Document Reviewed: 06/23/2007 ExitCare Patient Information 2014 [place].   ________________________________________________________________________ [facility]- Preparing for Total Shoulder Arthroplasty    Before surgery, you can play an important role. Because skin is not sterile, your skin needs to be as free of germs as possible. You can reduce the number of germs on your skin by using the following products. Benzoyl Peroxide Gel Reduces the number of germs present on the skin Applied twice a day to shoulder area starting two days before surgery    ==================================================================  Please follow these instructions carefully:  BENZOYL PEROXIDE 5% GEL  Please do not use if you have an allergy to benzoyl peroxide.   If your skin becomes reddened/irritated stop using the benzoyl peroxide.  Starting two days before surgery, apply as follows: Apply benzoyl peroxide in the morning and at night. Apply after taking a shower. If you are not taking a shower clean entire shoulder front, back, and side along with the armpit with a clean wet washcloth.  Place a quarter-sized dollop on your shoulder and rub in thoroughly, making sure to cover the front, back, and side of your shoulder, along with the armpit.   2 days before ____ AM   ____ PM              1 day before ____ AM   ____ PM                         Do this twice a day for two days.  (Last application is the night before surgery, AFTER using the CHG soap as described below).  Do NOT apply benzoyl peroxide gel on the day of surgery.

## 2021-10-02 NOTE — Progress Notes (Addendum)
COVID swab appointment: n/a  COVID Vaccine Completed: no Date COVID Vaccine completed: Has received booster: COVID vaccine manufacturer: [place]   Date of COVID positive in last 90 days: no  PCP - Manufacturing systems engineer - n/a  Chest x-ray - 08/03/21 Epic EKG - n/a Stress Test - n/a ECHO - n/a Cardiac Cath - n/a Pacemaker/ICD device last checked: n/a Spinal Cord Stimulator: n/a  Sleep Study - n/a CPAP -   Fasting Blood Sugar - n/a Checks Blood Sugar _____ times a day  Blood Thinner Instructions: n/a Aspirin Instructions: Last Dose:  Activity level: Can go up a flight of stairs and perform activities of daily living without stopping and without symptoms of chest pain or shortness of breath.       Anesthesia review:   Patient denies shortness of breath, fever, cough and chest pain at PAT appointment   Patient verbalized understanding of instructions that were given to them at the PAT appointment. Patient was also instructed that they will need to review over the PAT instructions again at home before surgery.

## 2021-10-03 ENCOUNTER — Encounter (HOSPITAL_COMMUNITY): Payer: Self-pay

## 2021-10-03 ENCOUNTER — Encounter (HOSPITAL_COMMUNITY)
Admission: RE | Admit: 2021-10-03 | Discharge: 2021-10-03 | Disposition: A | Discharge status: Home / Self Care | Payer: 59 | Source: Ambulatory Visit | Attending: Orthopedic Surgery | Admitting: Orthopedic Surgery

## 2021-10-03 DIAGNOSIS — Z01812 Encounter for preprocedural laboratory examination: Secondary | ICD-10-CM | POA: Diagnosis not present

## 2021-10-03 LAB — CBC
HCT: 47 % (ref 39.0–52.0)
Hemoglobin: 15.1 g/dL (ref 13.0–17.0)
MCH: 27.5 pg (ref 26.0–34.0)
MCHC: 32.1 g/dL (ref 30.0–36.0)
MCV: 85.6 fL (ref 80.0–100.0)
Platelets: 229 10*3/uL (ref 150–400)
RBC: 5.49 MIL/uL (ref 4.22–5.81)
RDW: 13.2 % (ref 11.5–15.5)
WBC: 7.8 10*3/uL (ref 4.0–10.5)
nRBC: 0 % (ref 0.0–0.2)

## 2021-10-03 LAB — SURGICAL PCR SCREEN
MRSA, PCR: NEGATIVE
Staphylococcus aureus: POSITIVE — AB

## 2021-10-12 ENCOUNTER — Ambulatory Visit (HOSPITAL_COMMUNITY): Payer: 59 | Admitting: Anesthesiology

## 2021-10-12 ENCOUNTER — Encounter (HOSPITAL_COMMUNITY): Payer: Self-pay | Admitting: Orthopedic Surgery

## 2021-10-12 ENCOUNTER — Ambulatory Visit (HOSPITAL_COMMUNITY)
Admission: RE | Admit: 2021-10-12 | Discharge: 2021-10-12 | Disposition: A | Discharge status: Home / Self Care | Payer: 59 | Source: Ambulatory Visit | Attending: Orthopedic Surgery | Admitting: Orthopedic Surgery

## 2021-10-12 ENCOUNTER — Encounter (HOSPITAL_COMMUNITY)
Admission: RE | Disposition: A | Discharge status: Home / Self Care | Payer: Self-pay | Source: Ambulatory Visit | Attending: Orthopedic Surgery

## 2021-10-12 DIAGNOSIS — E039 Hypothyroidism, unspecified: Secondary | ICD-10-CM | POA: Diagnosis not present

## 2021-10-12 DIAGNOSIS — Z79899 Other long term (current) drug therapy: Secondary | ICD-10-CM | POA: Insufficient documentation

## 2021-10-12 DIAGNOSIS — M19011 Primary osteoarthritis, right shoulder: Secondary | ICD-10-CM | POA: Diagnosis present

## 2021-10-12 DIAGNOSIS — Z79890 Hormone replacement therapy (postmenopausal): Secondary | ICD-10-CM | POA: Insufficient documentation

## 2021-10-12 HISTORY — PX: TOTAL SHOULDER ARTHROPLASTY: SHX126

## 2021-10-12 SURGERY — ARTHROPLASTY, SHOULDER, TOTAL
Anesthesia: General | Site: Shoulder | Laterality: Right

## 2021-10-12 MED ORDER — HYDROCODONE-ACETAMINOPHEN 5-325 MG PO TABS: 1.0000 | ORAL_TABLET | ORAL | 0 refills | Status: AC | PRN

## 2021-10-12 MED ORDER — NAPROXEN 500 MG PO TABS
500.0000 mg | ORAL_TABLET | Freq: Two times a day (BID) | ORAL | 1 refills | Status: AC
Start: 2021-10-12 — End: ?

## 2021-10-12 MED ORDER — OXYCODONE HCL 5 MG/5ML PO SOLN: 5.0000 mg | Freq: Once | ORAL | Status: AC | PRN

## 2021-10-12 MED ORDER — PHENYLEPHRINE 40 MCG/ML (10ML) SYRINGE FOR IV PUSH (FOR BLOOD PRESSURE SUPPORT)
PREFILLED_SYRINGE | INTRAVENOUS | Status: AC
  Filled 2021-10-12: qty 10

## 2021-10-12 MED ORDER — OXYCODONE HCL 5 MG PO TABS
5.0000 mg | ORAL_TABLET | Freq: Once | ORAL | Status: AC | PRN
Start: 2021-10-12 — End: 2021-10-12
  Administered 2021-10-12: 5 mg via ORAL

## 2021-10-12 MED ORDER — VANCOMYCIN HCL 1000 MG IV SOLR
INTRAVENOUS | Status: AC
  Filled 2021-10-12: qty 20

## 2021-10-12 MED ORDER — DEXAMETHASONE SODIUM PHOSPHATE 10 MG/ML IJ SOLN
INTRAMUSCULAR | Status: DC | PRN
  Administered 2021-10-12: 10 mg via INTRAVENOUS

## 2021-10-12 MED ORDER — TRANEXAMIC ACID-NACL 1000-0.7 MG/100ML-% IV SOLN
1000.0000 mg | INTRAVENOUS | Status: AC
  Administered 2021-10-12: 1000 mg via INTRAVENOUS
  Filled 2021-10-12: qty 100

## 2021-10-12 MED ORDER — OXYCODONE HCL 5 MG PO TABS
ORAL_TABLET | ORAL | Status: AC
  Filled 2021-10-12: qty 1

## 2021-10-12 MED ORDER — HYDROMORPHONE HCL 1 MG/ML IJ SOLN
0.2500 mg | INTRAMUSCULAR | Status: DC | PRN
  Administered 2021-10-12 (×3): 0.5 mg via INTRAVENOUS

## 2021-10-12 MED ORDER — METOCLOPRAMIDE HCL 5 MG PO TABS
5.0000 mg | ORAL_TABLET | Freq: Three times a day (TID) | ORAL | Status: DC | PRN
  Filled 2021-10-12: qty 2

## 2021-10-12 MED ORDER — FENTANYL CITRATE (PF) 250 MCG/5ML IJ SOLN
INTRAMUSCULAR | Status: DC | PRN
  Administered 2021-10-12: 100 ug via INTRAVENOUS

## 2021-10-12 MED ORDER — BUPIVACAINE LIPOSOME 1.3 % IJ SUSP
INTRAMUSCULAR | Status: DC | PRN
  Administered 2021-10-12: 10 mL via PERINEURAL

## 2021-10-12 MED ORDER — PROPOFOL 10 MG/ML IV BOLUS
INTRAVENOUS | Status: DC | PRN
  Administered 2021-10-12: 150 mg via INTRAVENOUS

## 2021-10-12 MED ORDER — LIDOCAINE 2% (20 MG/ML) 5 ML SYRINGE
INTRAMUSCULAR | Status: DC | PRN
  Administered 2021-10-12: 100 mg via INTRAVENOUS

## 2021-10-12 MED ORDER — HYDROMORPHONE HCL 2 MG PO TABS: 2.0000 mg | ORAL_TABLET | ORAL | 0 refills | Status: AC | PRN

## 2021-10-12 MED ORDER — LACTATED RINGERS IV BOLUS
500.0000 mL | Freq: Once | INTRAVENOUS | Status: AC
  Administered 2021-10-12: 500 mL via INTRAVENOUS

## 2021-10-12 MED ORDER — ONDANSETRON HCL 4 MG/2ML IJ SOLN: 4.0000 mg | Freq: Once | INTRAMUSCULAR | Status: DC | PRN

## 2021-10-12 MED ORDER — ONDANSETRON HCL 4 MG PO TABS
4.0000 mg | ORAL_TABLET | Freq: Four times a day (QID) | ORAL | Status: DC | PRN
  Filled 2021-10-12: qty 1

## 2021-10-12 MED ORDER — ACETAMINOPHEN 10 MG/ML IV SOLN: 1000.0000 mg | Freq: Once | INTRAVENOUS | Status: DC | PRN

## 2021-10-12 MED ORDER — EPHEDRINE SULFATE-NACL 50-0.9 MG/10ML-% IV SOSY
PREFILLED_SYRINGE | INTRAVENOUS | Status: DC | PRN
  Administered 2021-10-12 (×2): 5 mg via INTRAVENOUS
  Administered 2021-10-12: 10 mg via INTRAVENOUS

## 2021-10-12 MED ORDER — FENTANYL CITRATE (PF) 100 MCG/2ML IJ SOLN
INTRAMUSCULAR | Status: AC
  Filled 2021-10-12: qty 2

## 2021-10-12 MED ORDER — ONDANSETRON HCL 4 MG/2ML IJ SOLN
INTRAMUSCULAR | Status: DC | PRN
Start: 2021-10-12 — End: 2021-10-12
  Administered 2021-10-12: 4 mg via INTRAVENOUS

## 2021-10-12 MED ORDER — PHENYLEPHRINE 40 MCG/ML (10ML) SYRINGE FOR IV PUSH (FOR BLOOD PRESSURE SUPPORT)
PREFILLED_SYRINGE | INTRAVENOUS | Status: DC | PRN
  Administered 2021-10-12: 80 ug via INTRAVENOUS

## 2021-10-12 MED ORDER — LACTATED RINGERS IV SOLN: INTRAVENOUS | Status: DC

## 2021-10-12 MED ORDER — CYCLOBENZAPRINE HCL 10 MG PO TABS: 10.0000 mg | ORAL_TABLET | Freq: Three times a day (TID) | ORAL | 1 refills | Status: AC | PRN

## 2021-10-12 MED ORDER — ROCURONIUM BROMIDE 10 MG/ML (PF) SYRINGE
PREFILLED_SYRINGE | INTRAVENOUS | Status: AC
  Filled 2021-10-12: qty 10

## 2021-10-12 MED ORDER — VANCOMYCIN HCL 1000 MG IV SOLR
INTRAVENOUS | Status: DC | PRN
  Administered 2021-10-12: 1000 mg via TOPICAL

## 2021-10-12 MED ORDER — DEXAMETHASONE SODIUM PHOSPHATE 10 MG/ML IJ SOLN
INTRAMUSCULAR | Status: AC
  Filled 2021-10-12: qty 1

## 2021-10-12 MED ORDER — HYDROMORPHONE HCL 1 MG/ML IJ SOLN
INTRAMUSCULAR | Status: AC
  Administered 2021-10-12: 0.5 mg via INTRAVENOUS
  Filled 2021-10-12: qty 2

## 2021-10-12 MED ORDER — ONDANSETRON HCL 4 MG/2ML IJ SOLN: 4.0000 mg | Freq: Four times a day (QID) | INTRAMUSCULAR | Status: DC | PRN

## 2021-10-12 MED ORDER — ONDANSETRON 4 MG PO TBDP
ORAL_TABLET | ORAL | Status: AC
  Filled 2021-10-12: qty 1

## 2021-10-12 MED ORDER — SUGAMMADEX SODIUM 200 MG/2ML IV SOLN
INTRAVENOUS | Status: DC | PRN
  Administered 2021-10-12: 200 mg via INTRAVENOUS

## 2021-10-12 MED ORDER — LACTATED RINGERS IV BOLUS
250.0000 mL | Freq: Once | INTRAVENOUS | Status: AC
  Administered 2021-10-12: 250 mL via INTRAVENOUS

## 2021-10-12 MED ORDER — EPHEDRINE 5 MG/ML INJ
INTRAVENOUS | Status: AC
  Filled 2021-10-12: qty 5

## 2021-10-12 MED ORDER — MIDAZOLAM HCL 2 MG/2ML IJ SOLN
1.0000 mg | Freq: Once | INTRAMUSCULAR | Status: AC
  Administered 2021-10-12: 2 mg via INTRAVENOUS
  Filled 2021-10-12: qty 2

## 2021-10-12 MED ORDER — DIPHENHYDRAMINE HCL 25 MG PO CAPS
ORAL_CAPSULE | ORAL | Status: AC
  Filled 2021-10-12: qty 1

## 2021-10-12 MED ORDER — METOCLOPRAMIDE HCL 5 MG/ML IJ SOLN: 5.0000 mg | Freq: Three times a day (TID) | INTRAMUSCULAR | Status: DC | PRN

## 2021-10-12 MED ORDER — CHLORHEXIDINE GLUCONATE 0.12 % MT SOLN
15.0000 mL | Freq: Once | OROMUCOSAL | Status: AC
  Administered 2021-10-12: 15 mL via OROMUCOSAL

## 2021-10-12 MED ORDER — ONDANSETRON HCL 4 MG/2ML IJ SOLN
INTRAMUSCULAR | Status: AC
  Filled 2021-10-12: qty 2

## 2021-10-12 MED ORDER — CEFAZOLIN SODIUM-DEXTROSE 2-4 GM/100ML-% IV SOLN
2.0000 g | INTRAVENOUS | Status: AC
  Administered 2021-10-12: 2 g via INTRAVENOUS
  Filled 2021-10-12: qty 100

## 2021-10-12 MED ORDER — ORAL CARE MOUTH RINSE: 15.0000 mL | Freq: Once | OROMUCOSAL | Status: AC

## 2021-10-12 MED ORDER — BUPIVACAINE HCL (PF) 0.5 % IJ SOLN
INTRAMUSCULAR | Status: DC | PRN
  Administered 2021-10-12: 15 mL via PERINEURAL

## 2021-10-12 MED ORDER — PROPOFOL 10 MG/ML IV BOLUS
INTRAVENOUS | Status: AC
  Filled 2021-10-12: qty 20

## 2021-10-12 MED ORDER — FENTANYL CITRATE PF 50 MCG/ML IJ SOSY
50.0000 ug | PREFILLED_SYRINGE | Freq: Once | INTRAMUSCULAR | Status: AC
  Administered 2021-10-12: 50 ug via INTRAVENOUS
  Filled 2021-10-12: qty 2

## 2021-10-12 MED ORDER — ROCURONIUM BROMIDE 10 MG/ML (PF) SYRINGE
PREFILLED_SYRINGE | INTRAVENOUS | Status: DC | PRN
  Administered 2021-10-12: 60 mg via INTRAVENOUS

## 2021-10-12 MED ORDER — DIPHENHYDRAMINE HCL 25 MG PO CAPS
25.0000 mg | ORAL_CAPSULE | Freq: Once | ORAL | Status: AC
  Administered 2021-10-12: 25 mg via ORAL

## 2021-10-12 MED ORDER — ONDANSETRON 4 MG PO TBDP
4.0000 mg | ORAL_TABLET | Freq: Once | ORAL | Status: AC
  Administered 2021-10-12: 4 mg via ORAL

## 2021-10-12 MED ORDER — ONDANSETRON HCL 4 MG PO TABS: 4.0000 mg | ORAL_TABLET | Freq: Three times a day (TID) | ORAL | 0 refills | Status: AC | PRN

## 2021-10-12 MED ORDER — STERILE WATER FOR IRRIGATION IR SOLN
Status: DC | PRN
Start: 2021-10-12 — End: 2021-10-12
  Administered 2021-10-12: 2000 mL

## 2021-10-12 MED ORDER — LIDOCAINE HCL (PF) 2 % IJ SOLN
INTRAMUSCULAR | Status: AC
  Filled 2021-10-12: qty 5

## 2021-10-12 MED ORDER — 0.9 % SODIUM CHLORIDE (POUR BTL) OPTIME
TOPICAL | Status: DC | PRN
  Administered 2021-10-12: 1000 mL

## 2021-10-12 SURGICAL SUPPLY — 72 items
ADH SKN CLS APL DERMABOND .7 (GAUZE/BANDAGES/DRESSINGS) ×1
AID PSTN UNV HD RSTRNT DISP (MISCELLANEOUS) ×1
BAG COUNTER SPONGE SURGICOUNT (BAG) IMPLANT
BAG SPEC THK2 15X12 ZIP CLS (MISCELLANEOUS) ×1
BAG SPNG CNTER NS LX DISP (BAG)
BAG ZIPLOCK 12X15 (MISCELLANEOUS) ×2 IMPLANT
BIT DRILL 2.0X128 (BIT) IMPLANT
BLADE SAW SGTL 83.5X18.5 (BLADE) ×2 IMPLANT
BODY TRUNION ECLIPSE 45 SL (Shoulder) ×1 IMPLANT
CALIBRATOR GLENOID VIP 5-D (SYSTAGENIX WOUND MANAGEMENT) ×1 IMPLANT
CEMENT BONE DEPUY (Cement) ×2 IMPLANT
COOLER ICEMAN CLASSIC (MISCELLANEOUS) IMPLANT
COVER BACK TABLE 60X90IN (DRAPES) ×2 IMPLANT
COVER SURGICAL LIGHT HANDLE (MISCELLANEOUS) ×2 IMPLANT
DERMABOND ADVANCED (GAUZE/BANDAGES/DRESSINGS) ×1
DERMABOND ADVANCED .7 DNX12 (GAUZE/BANDAGES/DRESSINGS) ×1 IMPLANT
DRAPE ORTHO SPLIT 77X108 STRL (DRAPES) ×4
DRAPE SHEET LG 3/4 BI-LAMINATE (DRAPES) ×2 IMPLANT
DRAPE SURG 17X11 SM STRL (DRAPES) ×2 IMPLANT
DRAPE SURG ORHT 6 SPLT 77X108 (DRAPES) ×2 IMPLANT
DRAPE TOP 10253 STERILE (DRAPES) ×2 IMPLANT
DRAPE U-SHAPE 47X51 STRL (DRAPES) ×2 IMPLANT
DRSG AQUACEL AG ADV 3.5X 6 (GAUZE/BANDAGES/DRESSINGS) ×1 IMPLANT
DRSG AQUACEL AG ADV 3.5X10 (GAUZE/BANDAGES/DRESSINGS) ×2 IMPLANT
DURAPREP 26ML APPLICATOR (WOUND CARE) ×2 IMPLANT
ELECT BLADE TIP CTD 4 INCH (ELECTRODE) ×2 IMPLANT
ELECT REM PT RETURN 15FT ADLT (MISCELLANEOUS) ×2 IMPLANT
FACESHIELD WRAPAROUND (MASK) ×8 IMPLANT
FACESHIELD WRAPAROUND OR TEAM (MASK) ×4 IMPLANT
GLENOID UNI VAULTLOCK LRG (Shoulder) ×1 IMPLANT
GLOVE SRG 8 PF TXTR STRL LF DI (GLOVE) ×1 IMPLANT
GLOVE SURG ENC MOIS LTX SZ7 (GLOVE) ×2 IMPLANT
GLOVE SURG ENC MOIS LTX SZ7.5 (GLOVE) ×2 IMPLANT
GLOVE SURG ENC MOIS LTX SZ8 (GLOVE) ×2 IMPLANT
GLOVE SURG UNDER POLY LF SZ7 (GLOVE) ×2 IMPLANT
GLOVE SURG UNDER POLY LF SZ8 (GLOVE) ×2
GOWN STRL REUS W/TWL LRG LVL3 (GOWN DISPOSABLE) ×4 IMPLANT
HEAD HUM ECLIPSE 45/19 (Shoulder) ×1 IMPLANT
IMPL ECLIPSE SPEEDCAP (Shoulder) IMPLANT
IMPLANT ECLIPSE SPEEDCAP (Shoulder) ×2 IMPLANT
KIT BASIN OR (CUSTOM PROCEDURE TRAY) ×2 IMPLANT
MANIFOLD NEPTUNE II (INSTRUMENTS) ×2 IMPLANT
NDL TAPERED W/ NITINOL LOOP (MISCELLANEOUS) IMPLANT
NEEDLE TAPERED W/ NITINOL LOOP (MISCELLANEOUS) IMPLANT
NS IRRIG 1000ML POUR BTL (IV SOLUTION) ×2 IMPLANT
PACK SHOULDER (CUSTOM PROCEDURE TRAY) ×2 IMPLANT
PAD COLD SHLDR WRAP-ON (PAD) ×1 IMPLANT
PIN NITINOL TARGETER 2.8 (PIN) ×1 IMPLANT
PIN SET MODULAR GLENOID SYSTEM (PIN) IMPLANT
PROTECTOR NERVE ULNAR (MISCELLANEOUS) ×2 IMPLANT
RESTRAINT HEAD UNIVERSAL NS (MISCELLANEOUS) ×2 IMPLANT
SCREW SPINAL 40 F/CAGE LRG (Screw) ×1 IMPLANT
SIZER ECLIPSE CAGE SCREW (ORTHOPEDIC DISPOSABLE SUPPLIES) ×1 IMPLANT
SLING ARM FOAM STRAP LRG (SOFTGOODS) ×1 IMPLANT
SLING ARM FOAM STRAP MED (SOFTGOODS) IMPLANT
SLING ARM IMMOBILIZER LRG (SOFTGOODS) ×1 IMPLANT
SMARTMIX MINI TOWER (MISCELLANEOUS)
SPONGE T-LAP 18X18 ~~LOC~~+RFID (SPONGE) IMPLANT
SUCTION FRAZIER HANDLE 12FR (TUBING) ×2
SUCTION TUBE FRAZIER 12FR DISP (TUBING) ×1 IMPLANT
SUT FIBERWIRE #2 38 T-5 BLUE (SUTURE)
SUT MNCRL AB 3-0 PS2 18 (SUTURE) ×2 IMPLANT
SUT MON AB 2-0 CT1 36 (SUTURE) ×2 IMPLANT
SUT VIC AB 1 CT1 36 (SUTURE) ×2 IMPLANT
SUTURE FIBERWR #2 38 T-5 BLUE (SUTURE) IMPLANT
SUTURE TAPE 1.3 40 TPR END (SUTURE) IMPLANT
SUTURETAPE 1.3 40 TPR END (SUTURE)
TOWEL OR 17X26 10 PK STRL BLUE (TOWEL DISPOSABLE) ×2 IMPLANT
TOWEL OR NON WOVEN STRL DISP B (DISPOSABLE) ×2 IMPLANT
TOWER SMARTMIX MINI (MISCELLANEOUS) IMPLANT
WATER STERILE IRR 1000ML POUR (IV SOLUTION) ×4 IMPLANT
YANKAUER SUCT BULB TIP 10FT TU (MISCELLANEOUS) ×2 IMPLANT

## 2021-10-12 NOTE — Discharge Instructions (Signed)
 Kevin M. Supple, M.D., F.A.A.O.S. Orthopaedic Surgery Specializing in Arthroscopic and Reconstructive Surgery of the Shoulder [phone] [address] - [place], [place] [address] - Fax [phone]   POST-OP TOTAL SHOULDER REPLACEMENT INSTRUCTIONS  1. Follow up in the office for your first post-op appointment 10-14 days from the date of your surgery. If you do not already have a scheduled appointment, our office will contact you to schedule.  2. The bandage over your incision is waterproof. You may begin showering with this dressing on. You may leave this dressing on until first follow up appointment within 2 weeks. We prefer you leave this dressing in place until follow up however after 5-7 days if you are having itching or skin irritation and would like to remove it you may do so. Go slow and tug at the borders gently to break the bond the dressing has with the skin. At this point if there is no drainage it is okay to go without a bandage or you may cover it with a light guaze and tape. You can also expect significant bruising around your shoulder that will drift down your arm and into your chest wall. This is very normal and should resolve over several days.   3. Wear your sling/immobilizer at all times except to perform the exercises below or to occasionally let your arm dangle by your side to stretch your elbow. You also need to sleep in your sling immobilizer until instructed otherwise. It is ok to remove your sling if you are sitting in a controlled environment and allow your arm to rest in a position of comfort by your side or on your lap with pillows to give your neck and skin a break from the sling. You may remove it to allow arm to dangle by side to shower. If you are up walking around and when you go to sleep at night you need to wear it.  4. Range of motion to your elbow, wrist, and hand are encouraged 3-5 times daily. Exercise to your hand and fingers helps to reduce  swelling you may experience.   5. Prescriptions for a pain medication and a muscle relaxant are provided for you. It is recommended that if you are experiencing pain that you pain medication alone is not controlling, add the muscle relaxant along with the pain medication which can give additional pain relief. The first 1-2 days is generally the most severe of your pain and then should gradually decrease. As your pain lessens it is recommended that you decrease your use of the pain medications to an "as needed basis'" only and to always comply with the recommended dosages of the pain medications.  6. Pain medications can produce constipation along with their use. If you experience this, the use of an over the counter stool softener or laxative daily is recommended.   7. For additional questions or concerns, please do not hesitate to call the office. If after hours there is an answering service to forward your concerns to the physician on call.  8.Pain control following an exparel block  To help control your post-operative pain you received a nerve block  performed with Exparel which is a long acting anesthetic (numbing agent) which can provide pain relief and sensations of numbness (and relief of pain) in the operative shoulder and arm for up to 3 days. Sometimes it provides mixed relief, meaning you may still have numbness in certain areas of the arm but can still be able to   move  parts of that arm, hand, and fingers. We recommend that your prescribed pain medications  be used as needed. We do not feel it is necessary to "pre medicate" and "stay ahead" of pain.  Taking narcotic pain medications when you are not having any pain can lead to unnecessary and potentially dangerous side effects.    9. Use the ice machine as much as possible in the first 5-7 days from surgery, then you can wean its use to as needed. The ice typically needs to be replaced every 6 hours, instead of ice you can actually freeze  water bottles to put in the cooler and then fill water around them to avoid having to purchase ice. You can have spare water bottles freezing to allow you to rotate them once they have melted. Try to have a thin shirt or light cloth or towel under the ice wrap to protect your skin.   FOR ADDITIONAL INFO ON ICE MACHINE AND INSTRUCTIONS GO TO THE WEBSITE AT  https://[email or web address]/products/donjoy/[email or web address]  10.  We recommend that you avoid any dental work or cleaning in the first 3 months following your joint replacement. This is to help minimize the possibility of infection from the bacteria in your mouth that enters your bloodstream during dental work. We also recommend that you take an antibiotic prior to your dental work for the first year after your shoulder replacement to further help reduce that risk. Please simply contact our office for antibiotics to be sent to your pharmacy prior to dental work.  11. Dental Antibiotics:  In most cases prophylactic antibiotics for Dental procdeures after total joint surgery are not necessary.  Exceptions are as follows:  1. History of prior total joint infection  2. Severely immunocompromised (Organ Transplant, cancer chemotherapy, Rheumatoid biologic meds such as Humera)  3. Poorly controlled diabetes (A1C &gt; 8.0, blood glucose over 200)  If you have one of these conditions, contact your surgeon for an antibiotic prescription, prior to your dental procedure.   POST-OP EXERCISES  Pendulum Exercises  Perform pendulum exercises while standing and bending at the waist. Support your uninvolved arm on a table or chair and allow your operated arm to hang freely. Make sure to do these exercises passively - not using you shoulder muscles. These exercises can be performed once your nerve block effects have worn off.  Repeat 20 times. Do 3 sessions per day.     

## 2021-10-12 NOTE — Anesthesia Procedure Notes (Signed)
Procedure Name: Intubation Date/Time: 10/12/2021 9:54 AM Performed by: [place] Carry Weesner C, CRNA Pre-anesthesia Checklist: Patient identified, Emergency Drugs available, Suction available and Patient being monitored Patient Re-evaluated:Patient Re-evaluated prior to induction Oxygen Delivery Method: Circle system utilized Preoxygenation: Pre-oxygenation with 100% oxygen Induction Type: IV induction Ventilation: Mask ventilation without difficulty Laryngoscope Size: Mac and 4 Grade View: Grade I Tube type: Oral Tube size: 7.5 mm Number of attempts: 1 Airway Equipment and Method: Stylet and Oral airway Placement Confirmation: ETT inserted through vocal cords under direct vision, positive ETCO2 and breath sounds checked- equal and bilateral Secured at: 21 cm Tube secured with: Tape Dental Injury: Teeth and Oropharynx as per pre-operative assessment

## 2021-10-12 NOTE — Op Note (Signed)
10/12/2021  11:50 AM  PATIENT:   Lucas Mills  55 y.o. male  PRE-OPERATIVE DIAGNOSIS:  Right shoulder osteoarthritis  POST-OPERATIVE DIAGNOSIS: Same  PROCEDURE: Right shoulder stemless anatomic arthroplasty utilizing a size 45 trunnion, large cage screw, 45 x 19 humeral head, large glenoid  SURGEON:  Carolena Fairbank, Metta Clines M.D.  ASSISTANTS: Jenetta Loges, PA-C  ANESTHESIA:   General endotracheal and interscalene block with Exparel  EBL: 150 cc  SPECIMEN: None  Drains: None   PATIENT DISPOSITION:  PACU - hemodynamically stable.    PLAN OF CARE: Discharge to home after PACU  Brief history:  Lucas Mills is a 55 year old gentleman who has had a long history of progressive increasing right shoulder pain which is now progressed to the point that it significantly impacting his quality of life.  Plain radiographs confirm severe osteoarthritis with complete obliteration of the joint space, subchondral sclerosis, and peripheral osteophyte formation.  Due to his increasing pain and failure to respond to prolonged attempts at conservative management he is brought to the operating this time for planned right shoulder anatomic arthroplasty.  Preoperatively, I counseled the patient regarding treatment options and risks versus benefits thereof.  Possible surgical complications were all reviewed including potential for bleeding, infection, neurovascular injury, persistent pain, loss of motion, anesthetic complication, failure of the implant, and possible need for additional surgery. They understand and accept and agrees with our planned procedure.   Procedure detail:  After undergoing routine preop evaluation the patient received prophylactic antibiotics and interscalene block with Exparel was established in the holding area by the anesthesia department.  Subsequently placed spine on the operating table and underwent smooth duction of a general endotracheal anesthesia.  Placed into the beachchair  position and appropriately padded and protected.  The right shoulder girdle region was sterilely prepped and draped in standard fashion.  Timeout was called.  An 8 cm deltopectoral approach the right shoulder was made beginning at the coracoid process and extending laterally and distally.  Skin flaps were elevated and electrocautery was used for hemostasis.  The deltopectoral interval was then developed from proximal to distal with the vein taken laterally and the upper centimeter pectoralis major tendon was tenotomized for exposure.  The conjoined tendon was mobilized and retracted medially and adhesions were divided beneath the deltoid.  The long head biceps tendon was then unroofed and tenodesed at the upper border of the pectoralis major tendon and the proximal segment was then unroofed and excised.  The rotator cuff was then split along the rotator interval from the apex of the bicipital groove to the base of the coracoid and the insertion of the subscapularis was then carefully identified superiorly and inferiorly.  An oscillating saw was then used to perform a lesser tuberosity osteotomy removing a thin wafer of bone and the subscap was then tagged and reflected medially.  Capsular attachments were then divided from the anterior and infra margins of the humeral neck allowing deliver the humeral head through the wound.  An extra medullary guide was then used to outline a proposed humeral head resection which was performed with an oscillating saw approximately 30 degrees retroversion matching the native retroversion and carefully protecting the surrounding soft tissues.  A rondure was then used to remove the large osteophytes and the margins of the humeral neck.  The humeral metaphysis was then sized at a 45 and the metaphysis was prepared with the coring guide and measured at a large cage screw length.  A metal cap was then  placed at the cut proximal humeral surface we then exposed the glenoid with appropriate  retractors.  A circumferential labral resection was completed such that complete visualization of the peripheral glenoid could be achieved.  We then used the VIP guide to place a guidepin into the center of the glenoid based on the preop CT scan planning.  Guidepin was then seated glenoid was then reamed correcting some of the moderate retroversion as well as the B2 glenoid to a stable subchondral bony bed.  Preparation completed then with the central drill hole followed by the superior and inferior peg and slot respectively and the glenoid was then broached with excellent fit and fixation the trial showed excellent fit.  This point the glenoid was meticulously cleaned and dried.  Cement was then mixed and introduced in the superior and inferior peg and slot respectively and the central peg had morselized bone from the resected humeral head packed around and the glenoid was then seated with excellent fit and fixation.  We returned attention to the proximal humerus where the 45 trunnion was placed into position and the large cage screw was then filled with bone graft harvested from the humeral head and cage screw was seated with excellent purchase and fixation.  We then performed a series of trial reductions and ultimately felt that the 45 x 19 humeral head gave is the best soft tissue balance with approximately 50% translation of elasticity good soft tissue tension good stability.  This point the trial was removed.  We had passed a suture tape suture through the eyelid on the collar of the trunnion and placed 2 additional medial row anchors for our subscap repair.  At this point the implant was cleaned and dried and the final 45 x 19 head was impacted and final reduction was then performed.  We then mobilized the subscapularis confirming good elasticity.  Our medial row suture limbs were then passed up through the bone tendon junction of the subscapularis equidistance across the width of the footprint and these were  then passed into 2 lateral row anchors allowing a double row repair of the lesser tuberosity osteotomy with excellent stability and tension.  We also repaired the rotator interval with a series of figure-of-eight suture tape sutures allowing excellent apposition.  Upon completion the arm easily achieved 30 degrees of external Tatian without excessive tension on the subscap repair.  Final irrigation was completed.  Hemostasis was obtained.  Vancomycin powder was then spread liberally throughout the deep soft tissue layers.  The deltopectoral interval was reapproximated with a series of figure-of-eight #1 Vicryl sutures.  2-0 Monocryl used to close the subcu layer and intracuticular 3-0 Monocryl for the skin followed by Dermabond and Aquacel dressing.  The right arm was then placed into a sling and the patient was awakened, extubated, and taken to the recovery room in stable condition.  Jenetta Loges, PA-C was utilized as an Environmental consultant throughout this case, essential for help with positioning the patient, positioning extremity, tissue manipulation, implantation of the prosthesis, suture management, wound closure, and intraoperative decision-making.  Marin Shutter MD   Contact # [phone]

## 2021-10-12 NOTE — Anesthesia Postprocedure Evaluation (Signed)
Anesthesia Post Note  Patient: Lucas Mills  Procedure(s) Performed: TOTAL SHOULDER ARTHROPLASTY (Right: Shoulder)     Patient location during evaluation: PACU Anesthesia Type: General Level of consciousness: awake and alert Pain management: pain level controlled Vital Signs Assessment: post-procedure vital signs reviewed and stable Respiratory status: spontaneous breathing, nonlabored ventilation, respiratory function stable and patient connected to nasal cannula oxygen Cardiovascular status: blood pressure returned to baseline and stable Postop Assessment: no apparent nausea or vomiting Anesthetic complications: no   No notable events documented.  Last Vitals:  Vitals:   10/12/21 1243 10/12/21 1300  BP: (!) 142/84 133/83  Pulse: 68 75  Resp: 12   Temp:    SpO2: 98% 99%    Last Pain:  Vitals:   10/12/21 1302  TempSrc:   PainSc: 3                  Janiene Aarons S

## 2021-10-12 NOTE — Anesthesia Procedure Notes (Signed)
Anesthesia Regional Block: Interscalene brachial plexus block   Pre-Anesthetic Checklist: , timeout performed,  Correct Patient, Correct Site, Correct Laterality,  Correct Procedure, Correct Position, site marked,  Risks and benefits discussed,  Surgical consent,  Pre-op evaluation,  At surgeon's request and post-op pain management  Laterality: Right  Prep: chloraprep       Needles:  Injection technique: Single-shot  Needle Type: Echogenic Needle     Needle Length: 9cm      Additional Needles:   Procedures:,,,, ultrasound used (permanent image in chart),,    Narrative:  Start time: 10/12/2021 8:52 AM End time: 10/12/2021 9:02 AM Injection made incrementally with aspirations every 5 mL.  Performed by: Personally  Anesthesiologist: Myrtie Soman, MD  Additional Notes: Patient tolerated the procedure well without complications

## 2021-10-12 NOTE — Transfer of Care (Signed)
Immediate Anesthesia Transfer of Care Note  Patient: Lucas Mills  Procedure(s) Performed: TOTAL SHOULDER ARTHROPLASTY (Right: Shoulder)  Patient Location: PACU  Anesthesia Type:GA combined with regional for post-op pain  Level of Consciousness: awake, alert  and oriented  Airway & Oxygen Therapy: Patient Spontanous Breathing and Patient connected to face mask oxygen  Post-op Assessment: Report given to RN and Post -op Vital signs reviewed and stable  Post vital signs: Reviewed and stable  Last Vitals:  Vitals Value Taken Time  BP 150/118 10/12/21 1200  Temp    Pulse 67 10/12/21 1205  Resp 15 10/12/21 1205  SpO2 99 % 10/12/21 1205  Vitals shown include unvalidated device data.  Last Pain:  Vitals:   10/12/21 0747  TempSrc: Oral         Complications: No notable events documented.

## 2021-10-12 NOTE — Evaluation (Signed)
Occupational Therapy Evaluation Patient Details Name: Lucas Mills MRN: [id number] DOB: 1966-05-26 Today's Date: 10/12/2021   History of Present Illness 55 y.o. male s/p R Total shoulder arthroplasty 10/20. PMH of hypothyroidism and  shoulder arthroscopy with labral repair L (2014).   Clinical Impression   PTA, pt was living at home with wife and independent in ADLs/IADLs. Pt s/p shoulder replacement without functional use of right dominant upper extremity secondary to effects of surgery and interscalene block and shoulder precautions. Therapist provided education and instruction to patient and spouse in regards to exercises, precautions, positioning, donning upper extremity clothing and bathing while maintaining shoulder precautions, ice and edema management and donning/doffing sling. Patient and spouse verbalized understanding and demonstrated as needed. Pt with emesis during session, RN made aware and addressed. Patient needed assistance to donn shirt, underwear, pants, socks and shoes and provided with instruction on compensatory strategies to perform ADLs. Patient to follow up with MD for further therapy needs.       Recommendations for follow up therapy are one component of a multi-disciplinary discharge planning process, led by the attending physician.  Recommendations may be updated based on patient status, additional functional criteria and insurance authorization.   Follow Up Recommendations  No OT follow up;Follow surgeon's recommendation for DC plan and follow-up therapies    Equipment Recommendations  None recommended by OT    Recommendations for Other Services       Precautions / Restrictions Precautions Precautions: Shoulder Shoulder Interventions: Shoulder sling/immobilizer;Off for dressing/bathing/exercises Precaution Booklet Issued: Yes (comment) Precaution Comments: Per MD orders "May come out of sling if sitting in controlled environment. ie while watching tv,  eating etc to give neck and skin break from sling. Please sleep in sling though until 4 weeks post op.     Ok to use operative arm to assist in feeding, bathing, ADL's.       New PROM restrictions (8/18) for use in hygiene and ADL only   ER 20   ABD 45   FE 60     Pendulums are to be gentle and are the preferred exercise to be instructed for patients to perform at home.( Along with elbow wrist and hand exercise)" Restrictions Weight Bearing Restrictions: Yes RUE Weight Bearing: Non weight bearing      Mobility Bed Mobility                    Transfers Overall transfer level: Needs assistance   Transfers: Sit to/from Stand Sit to Stand: Min guard         General transfer comment: Min guard for safety, pt with drowsiness    Balance Overall balance assessment: Modified Independent                                         ADL either performed or assessed with clinical judgement   ADL Overall ADL's : Needs assistance/impaired                                     Functional mobility during ADLs: Min guard General ADL Comments: Difficult to assess, wife assisted with dressing, pt with [place] and drowsiness throughout session. Caregiver independent in ADLs     Vision Patient Visual Report: No change from baseline       Perception  Praxis      Pertinent Vitals/Pain Pain Assessment: No/denies pain     Hand Dominance Right   Extremity/Trunk Assessment Upper Extremity Assessment Upper Extremity Assessment: RUE deficits/detail RUE Deficits / Details: residual effects from nerve block limiting RUE function, numbness       Cervical / Trunk Assessment Cervical / Trunk Assessment: Normal   Communication Communication Communication: No difficulties   Cognition Arousal/Alertness: Awake/alert Behavior During Therapy: WFL for tasks assessed/performed Overall Cognitive Status: Within Functional Limits for tasks assessed                                  General Comments: With side effects from pain medication, with emesis and drowsiness during eval.   General Comments       Exercises Exercises: Shoulder   Shoulder Instructions Shoulder Instructions Donning/doffing shirt without moving shoulder: Caregiver independent with task;Patient able to independently direct caregiver Method for sponge bathing under operated UE: Independent Donning/doffing sling/immobilizer: Caregiver independent with task;Patient able to independently direct caregiver Correct positioning of sling/immobilizer: Caregiver independent with task;Patient able to independently direct caregiver Pendulum exercises (written home exercise program): Patient able to independently direct caregiver;Caregiver independent with task ROM for elbow, wrist and digits of operated UE: Patient able to independently direct caregiver;Independent Sling wearing schedule (on at all times/off for ADL's): Independent Proper positioning of operated UE when showering: Independent Dressing change: Caregiver independent with task;Patient able to independently direct caregiver Positioning of UE while sleeping: Patient able to independently direct caregiver;Caregiver independent with task    Home Living Family/patient expects to be discharged to:: Private residence Living Arrangements: Spouse/significant other Available Help at Discharge: Family Type of Home: House Home Access: Stairs to enter Technical brewer of Steps: 1         Bathroom Shower/Tub: Tub/shower unit                    Prior Functioning/Environment Level of Independence: Independent        Comments: Independent in ADLs/IADLs and currently working        OT Problem List: Decreased strength;Decreased range of motion;Decreased knowledge of precautions;Impaired UE functional use      OT Treatment/Interventions:      OT Goals(Current goals can be found in the care plan  section)    OT Frequency:     Barriers to D/C:            Co-evaluation              AM-PAC OT "6 Clicks" Daily Activity     Outcome Measure Help from another person eating meals?: None Help from another person taking care of personal grooming?: A Little Help from another person toileting, which includes using toliet, bedpan, or urinal?: A Little Help from another person bathing (including washing, rinsing, drying)?: A Little Help from another person to put on and taking off regular upper body clothing?: A Lot Help from another person to put on and taking off regular lower body clothing?: A Little 6 Click Score: 18   End of Session Nurse Communication: Mobility status  Activity Tolerance: Patient tolerated treatment well Patient left: in chair;with call bell/phone within reach;with family/visitor present  OT Visit Diagnosis: Muscle weakness (generalized) (M62.81)                Time: 3536-1443 OT Time Calculation (min): 37 min Charges:  OT General Charges $OT Visit: 1 Visit OT Evaluation $  OT Eval Low Complexity: 1 Low OT Treatments $Self Care/Home Management : 8-22 mins  Jackquline Denmark, OTS Acute Rehab Office: [phone]   Lucas Mills 10/12/2021, 3:06 PM

## 2021-10-12 NOTE — Progress Notes (Signed)
Assisted Dr. Rose with right, ultrasound guided, interscalene  block. Side rails up, monitors on throughout procedure. See vital signs in flow sheet. Tolerated Procedure well. 

## 2021-10-12 NOTE — Anesthesia Preprocedure Evaluation (Signed)
Anesthesia Evaluation  Patient identified by MRN, date of birth, ID band Patient awake    Reviewed: Allergy & Precautions, NPO status , Patient's Chart, lab work & pertinent test results  Airway Mallampati: II  TM Distance: >3 FB Neck ROM: Full    Dental no notable dental hx.    Pulmonary neg pulmonary ROS,    Pulmonary exam normal breath sounds clear to auscultation       Cardiovascular negative cardio ROS Normal cardiovascular exam Rhythm:Regular Rate:Normal     Neuro/Psych negative neurological ROS  negative psych ROS   GI/Hepatic negative GI ROS, Neg liver ROS,   Endo/Other  Hypothyroidism   Renal/GU negative Renal ROS  negative genitourinary   Musculoskeletal  (+) Arthritis , Osteoarthritis,    Abdominal   Peds negative pediatric ROS (+)  Hematology negative hematology ROS (+)   Anesthesia Other Findings   Reproductive/Obstetrics negative OB ROS                             Anesthesia Physical Anesthesia Plan  ASA: 2  Anesthesia Plan: General   Post-op Pain Management:  Regional for Post-op pain   Induction: Intravenous  PONV Risk Score and Plan: 2 and Ondansetron, Dexamethasone and Treatment may vary due to age or medical condition  Airway Management Planned: Oral ETT  Additional Equipment:   Intra-op Plan:   Post-operative Plan: Extubation in OR  Informed Consent: I have reviewed the patients History and Physical, chart, labs and discussed the procedure including the risks, benefits and alternatives for the proposed anesthesia with the patient or authorized representative who has indicated his/her understanding and acceptance.     Dental advisory given  Plan Discussed with: CRNA and Surgeon  Anesthesia Plan Comments:         Anesthesia Quick Evaluation

## 2021-10-12 NOTE — H&P (Signed)
Lucas Mills    Chief Complaint: Right shoulder osteoarthritis HPI: The patient is a 55 y.o. male with chronic and progressive increasing right shoulder pain related to severe osteoarthritis.  Due to his increasing functional imitations and failure to respond to prolonged attempts at conservative management, he is brought to the operating room at this time for planned right shoulder anatomic arthroplasty  Past Medical History:  Diagnosis Date   Headache(784.0)    ocular migraines    Hypothyroidism     Past Surgical History:  Procedure Laterality Date   COLONOSCOPY  2007   COLONOSCOPY W/ BIOPSIES AND POLYPECTOMY  2013   polpy removed   HERNIA REPAIR Left    SHOULDER ARTHROSCOPY WITH LABRAL REPAIR Left 10/08/2013   Procedure: LEFT SHOULDER ARTHROSCOPY WITH LABRAL REPAIR;  Surgeon: Marin Shutter, MD;  Location: [facility];  Service: Orthopedics;  Laterality: Left;   VARICOCELECTOMY Left    with mesh    History reviewed. No pertinent family history.  Social History:  reports that he has never smoked. He has never used smokeless tobacco. He reports current alcohol use. He reports that he does not use drugs.   Medications Prior to Admission  Medication Sig Dispense Refill   gabapentin (NEURONTIN) 300 MG capsule Take 300 mg by mouth daily.     levothyroxine (SYNTHROID) 137 MCG tablet Take 137 mcg by mouth See admin instructions. Mon-Wed  0   levothyroxine (SYNTHROID) 150 MCG tablet Take 150 mcg by mouth See admin instructions. Thursday-Sunday       Physical Exam: Right shoulder demonstrates painful range of motion as noted at his recent office visit.  He maintains excellent strength to manual muscle testing.  Plain radiographs confirm severe osteoarthritis with complete obliteration of the joint space, subchondral sclerosis, and peripheral osteophyte formation.  Preoperative CT scan obtained confirming baseline alignment and has been run through the templating software for optimal  positioning of the implant.  Vitals  Temp:  [97.7 F (36.5 C)] 97.7 F (36.5 C) (10/20 0747) Pulse Rate:  [59] 59 (10/20 0747) Resp:  [16] 16 (10/20 0747) BP: (127)/(82) 127/82 (10/20 0747) SpO2:  [100 %] 100 % (10/20 0747) Weight:  [73 kg] 73 kg (10/20 0833)  Assessment/Plan  Impression: Right shoulder osteoarthritis  Plan of Action: Procedure(s): TOTAL SHOULDER ARTHROPLASTY  [name] 10/12/2021, 9:00 AM Contact # [phone]

## 2021-10-12 NOTE — Anesthesia Procedure Notes (Signed)
Anesthesia Procedure Image    

## 2021-10-18 ENCOUNTER — Encounter (HOSPITAL_COMMUNITY): Payer: Self-pay | Admitting: Orthopedic Surgery

## 2022-01-01 DIAGNOSIS — Z471 Aftercare following joint replacement surgery: Secondary | ICD-10-CM | POA: Diagnosis not present

## 2022-01-01 DIAGNOSIS — Z4789 Encounter for other orthopedic aftercare: Secondary | ICD-10-CM | POA: Diagnosis not present

## 2022-01-23 DIAGNOSIS — Z23 Encounter for immunization: Secondary | ICD-10-CM | POA: Diagnosis not present

## 2022-03-05 DIAGNOSIS — M25511 Pain in right shoulder: Secondary | ICD-10-CM | POA: Diagnosis not present

## 2022-03-05 DIAGNOSIS — M25561 Pain in right knee: Secondary | ICD-10-CM | POA: Diagnosis not present

## 2022-03-22 DIAGNOSIS — M25562 Pain in left knee: Secondary | ICD-10-CM | POA: Diagnosis not present

## 2022-07-03 DIAGNOSIS — M7542 Impingement syndrome of left shoulder: Secondary | ICD-10-CM | POA: Diagnosis not present

## 2022-07-03 DIAGNOSIS — M19012 Primary osteoarthritis, left shoulder: Secondary | ICD-10-CM | POA: Diagnosis not present

## 2022-07-03 DIAGNOSIS — Z96611 Presence of right artificial shoulder joint: Secondary | ICD-10-CM | POA: Diagnosis not present

## 2022-07-05 IMAGING — CT CT SHOULDER*R* W/O CM
2 series · 11 of 14 positions shown, 12 images · non-contrast
Comparison: None.

CLINICAL DATA: Preop, left shoulder pain

EXAM:
CT OF THE UPPER RIGHT EXTREMITY WITHOUT CONTRAST
TECHNIQUE: Multidetector CT imaging of the upper right extremity was performed
according to the standard protocol.

[Series 3: soft tissue · axial · 0.55mm/px · z∈[-249,-29]mm · 3 of 111 slices shown, 4 images]
[im 1/111  soft-tissue]
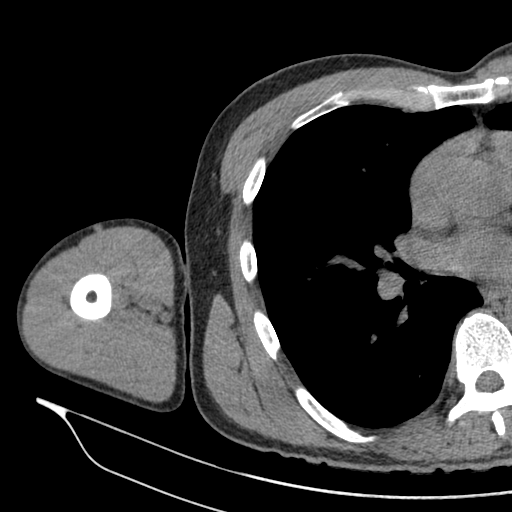
[im 1/111  bone]
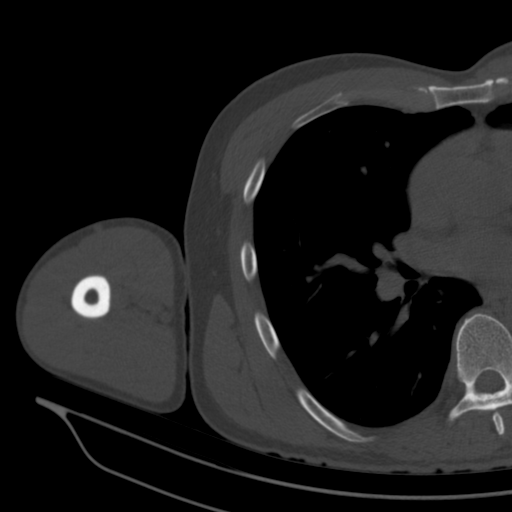
[im 56/111  bone]
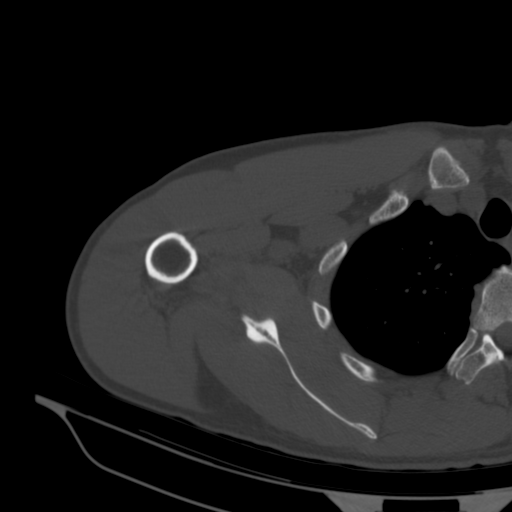
[im 111/111  bone]
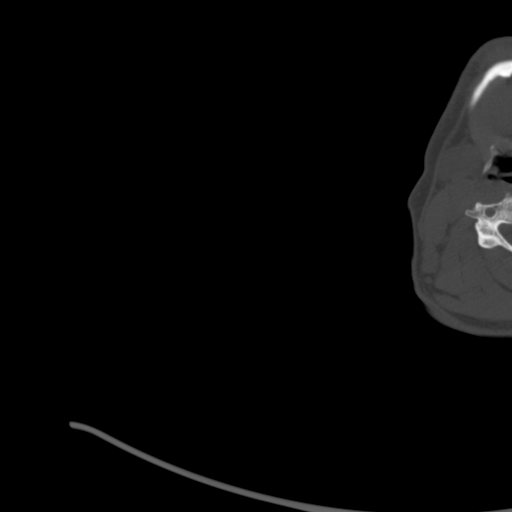

[Series 5: thin soft · axial · 0.55mm/px · z∈[-227,-51]mm · 8 of 367 slices shown]
[im 37/367  soft-tissue]
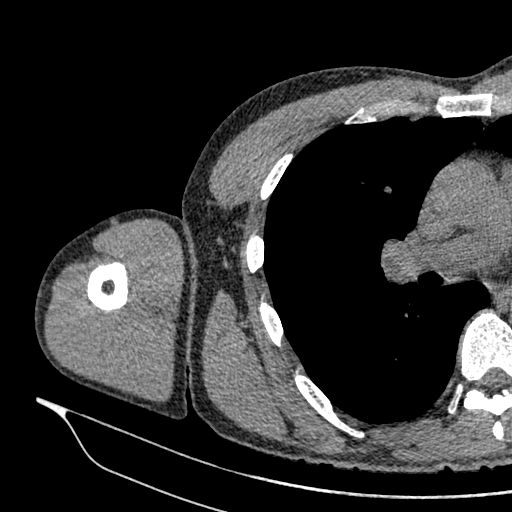
[im 74/367  soft-tissue]
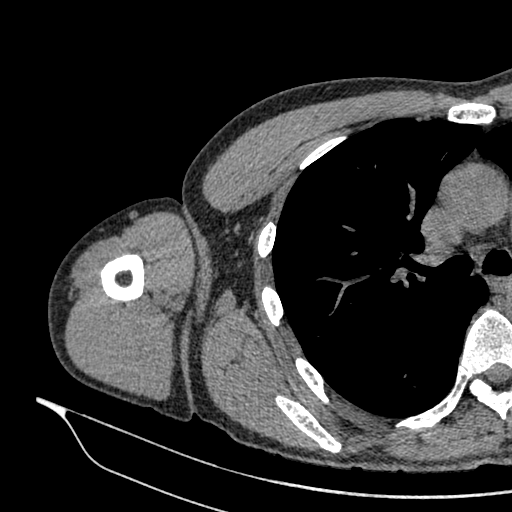
[im 110/367  soft-tissue]
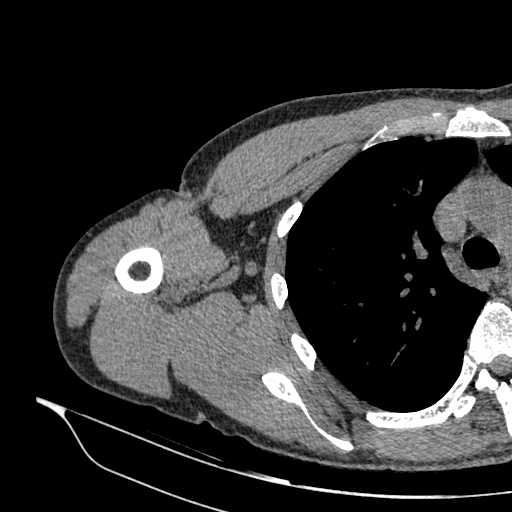
[im 147/367  soft-tissue]
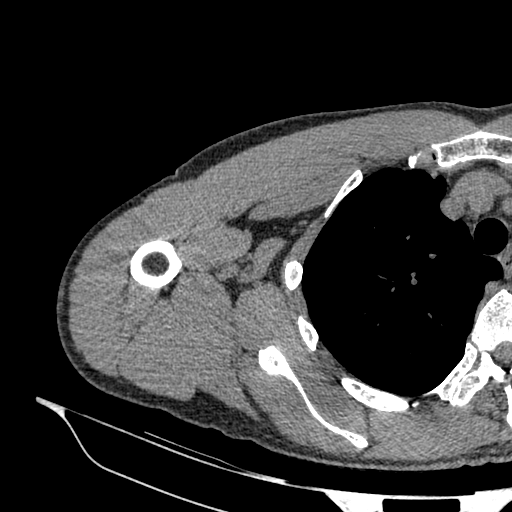
[im 220/367  soft-tissue]
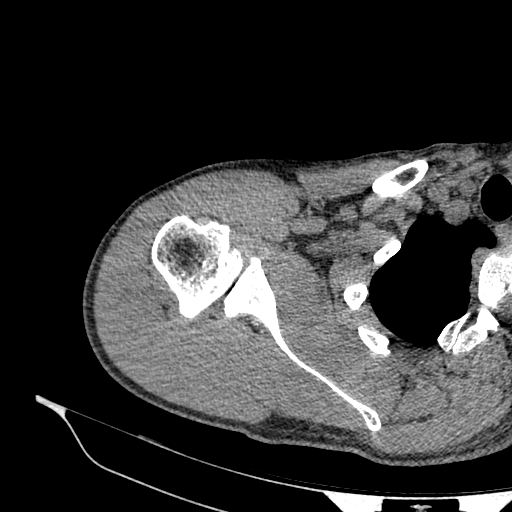
[im 257/367  soft-tissue]
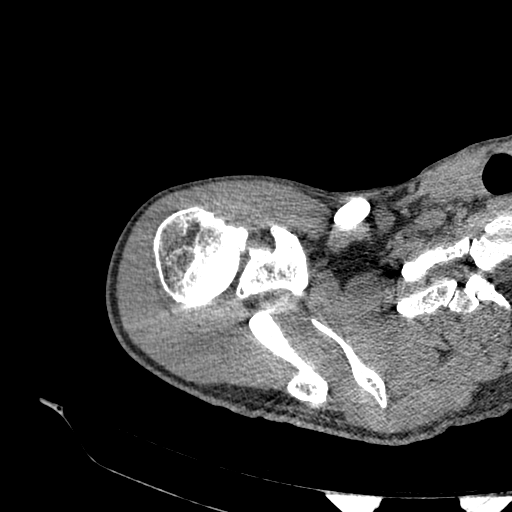
[im 293/367  soft-tissue]
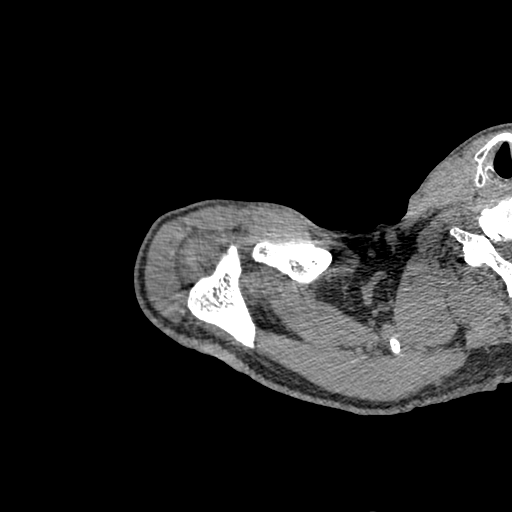
[im 330/367  soft-tissue]
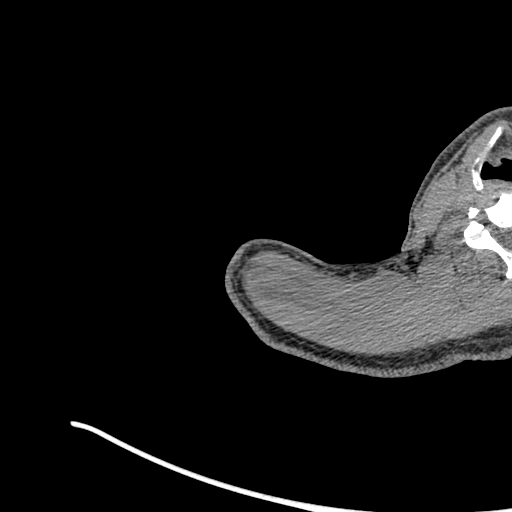

[11 of 14 positions shown; findings below may reference images not displayed]

FINDINGS: Bones/Joint/Cartilage

There is moderate glenohumeral osteoarthritis with bulky osteophyte
formation. There is subchondral sclerosis and cystic change.
Mild-to-moderate AC joint arthritis. Adequate glenoid bone stock.
Neutral glenoid version. There is no acute osseous abnormality.
Normal glenohumeral alignment.

Ligaments

Suboptimally assessed by CT.

Muscles and Tendons

No significant muscle atrophy.

Soft tissues

No focal fluid collection.
IMPRESSION: Moderate glenohumeral osteoarthritis with bulky osteophyte
formation. Adequate glenoid bone stock with neutral glenoid version.
No muscle atrophy.

Mild-to-moderate AC joint arthritis.

## 2022-08-07 DIAGNOSIS — Z125 Encounter for screening for malignant neoplasm of prostate: Secondary | ICD-10-CM | POA: Diagnosis not present

## 2022-08-07 DIAGNOSIS — Z0000 Encounter for general adult medical examination without abnormal findings: Secondary | ICD-10-CM | POA: Diagnosis not present

## 2022-08-07 DIAGNOSIS — E039 Hypothyroidism, unspecified: Secondary | ICD-10-CM | POA: Diagnosis not present

## 2022-08-07 DIAGNOSIS — Z13220 Encounter for screening for lipoid disorders: Secondary | ICD-10-CM | POA: Diagnosis not present

## 2022-09-10 DIAGNOSIS — R748 Abnormal levels of other serum enzymes: Secondary | ICD-10-CM | POA: Diagnosis not present

## 2022-12-11 DIAGNOSIS — M19212 Secondary osteoarthritis, left shoulder: Secondary | ICD-10-CM | POA: Diagnosis not present

## 2022-12-11 DIAGNOSIS — M67912 Unspecified disorder of synovium and tendon, left shoulder: Secondary | ICD-10-CM | POA: Diagnosis not present

## 2022-12-26 DIAGNOSIS — M25512 Pain in left shoulder: Secondary | ICD-10-CM | POA: Diagnosis not present

## 2023-04-19 DIAGNOSIS — D122 Benign neoplasm of ascending colon: Secondary | ICD-10-CM | POA: Diagnosis not present

## 2023-04-19 DIAGNOSIS — K648 Other hemorrhoids: Secondary | ICD-10-CM | POA: Diagnosis not present

## 2023-04-19 DIAGNOSIS — K5730 Diverticulosis of large intestine without perforation or abscess without bleeding: Secondary | ICD-10-CM | POA: Diagnosis not present

## 2023-04-19 DIAGNOSIS — Z09 Encounter for follow-up examination after completed treatment for conditions other than malignant neoplasm: Secondary | ICD-10-CM | POA: Diagnosis not present

## 2023-04-19 DIAGNOSIS — D120 Benign neoplasm of cecum: Secondary | ICD-10-CM | POA: Diagnosis not present

## 2023-04-19 DIAGNOSIS — Z86010 Personal history of colonic polyps: Secondary | ICD-10-CM | POA: Diagnosis not present

## 2023-08-06 DIAGNOSIS — Z0000 Encounter for general adult medical examination without abnormal findings: Secondary | ICD-10-CM | POA: Diagnosis not present

## 2023-08-06 DIAGNOSIS — E782 Mixed hyperlipidemia: Secondary | ICD-10-CM | POA: Diagnosis not present

## 2023-08-06 DIAGNOSIS — E039 Hypothyroidism, unspecified: Secondary | ICD-10-CM | POA: Diagnosis not present

## 2023-08-06 DIAGNOSIS — N400 Enlarged prostate without lower urinary tract symptoms: Secondary | ICD-10-CM | POA: Diagnosis not present

## 2023-08-06 DIAGNOSIS — Z125 Encounter for screening for malignant neoplasm of prostate: Secondary | ICD-10-CM | POA: Diagnosis not present

## 2023-08-06 DIAGNOSIS — Z1389 Encounter for screening for other disorder: Secondary | ICD-10-CM | POA: Diagnosis not present

## 2023-08-21 DIAGNOSIS — M5451 Vertebrogenic low back pain: Secondary | ICD-10-CM | POA: Diagnosis not present

## 2023-08-21 DIAGNOSIS — M5441 Lumbago with sciatica, right side: Secondary | ICD-10-CM | POA: Diagnosis not present

## 2023-08-21 DIAGNOSIS — M5387 Other specified dorsopathies, lumbosacral region: Secondary | ICD-10-CM | POA: Diagnosis not present

## 2023-08-21 DIAGNOSIS — M9903 Segmental and somatic dysfunction of lumbar region: Secondary | ICD-10-CM | POA: Diagnosis not present

## 2023-08-22 DIAGNOSIS — M5387 Other specified dorsopathies, lumbosacral region: Secondary | ICD-10-CM | POA: Diagnosis not present

## 2023-08-22 DIAGNOSIS — M5451 Vertebrogenic low back pain: Secondary | ICD-10-CM | POA: Diagnosis not present

## 2023-08-22 DIAGNOSIS — M9903 Segmental and somatic dysfunction of lumbar region: Secondary | ICD-10-CM | POA: Diagnosis not present

## 2023-08-22 DIAGNOSIS — M5441 Lumbago with sciatica, right side: Secondary | ICD-10-CM | POA: Diagnosis not present

## 2023-08-28 DIAGNOSIS — M9903 Segmental and somatic dysfunction of lumbar region: Secondary | ICD-10-CM | POA: Diagnosis not present

## 2023-08-28 DIAGNOSIS — M5451 Vertebrogenic low back pain: Secondary | ICD-10-CM | POA: Diagnosis not present

## 2023-08-28 DIAGNOSIS — M5441 Lumbago with sciatica, right side: Secondary | ICD-10-CM | POA: Diagnosis not present

## 2023-08-28 DIAGNOSIS — M5387 Other specified dorsopathies, lumbosacral region: Secondary | ICD-10-CM | POA: Diagnosis not present

## 2023-08-29 DIAGNOSIS — M5441 Lumbago with sciatica, right side: Secondary | ICD-10-CM | POA: Diagnosis not present

## 2023-08-29 DIAGNOSIS — M5451 Vertebrogenic low back pain: Secondary | ICD-10-CM | POA: Diagnosis not present

## 2023-08-29 DIAGNOSIS — M5387 Other specified dorsopathies, lumbosacral region: Secondary | ICD-10-CM | POA: Diagnosis not present

## 2023-08-29 DIAGNOSIS — M9903 Segmental and somatic dysfunction of lumbar region: Secondary | ICD-10-CM | POA: Diagnosis not present

## 2023-09-02 DIAGNOSIS — M9903 Segmental and somatic dysfunction of lumbar region: Secondary | ICD-10-CM | POA: Diagnosis not present

## 2023-09-02 DIAGNOSIS — M5451 Vertebrogenic low back pain: Secondary | ICD-10-CM | POA: Diagnosis not present

## 2023-09-02 DIAGNOSIS — M5387 Other specified dorsopathies, lumbosacral region: Secondary | ICD-10-CM | POA: Diagnosis not present

## 2023-09-02 DIAGNOSIS — M5441 Lumbago with sciatica, right side: Secondary | ICD-10-CM | POA: Diagnosis not present

## 2023-09-03 DIAGNOSIS — R944 Abnormal results of kidney function studies: Secondary | ICD-10-CM | POA: Diagnosis not present

## 2023-09-04 DIAGNOSIS — M5387 Other specified dorsopathies, lumbosacral region: Secondary | ICD-10-CM | POA: Diagnosis not present

## 2023-09-04 DIAGNOSIS — M9903 Segmental and somatic dysfunction of lumbar region: Secondary | ICD-10-CM | POA: Diagnosis not present

## 2023-09-04 DIAGNOSIS — M5441 Lumbago with sciatica, right side: Secondary | ICD-10-CM | POA: Diagnosis not present

## 2023-09-04 DIAGNOSIS — M5451 Vertebrogenic low back pain: Secondary | ICD-10-CM | POA: Diagnosis not present

## 2023-09-06 DIAGNOSIS — M5441 Lumbago with sciatica, right side: Secondary | ICD-10-CM | POA: Diagnosis not present

## 2023-09-06 DIAGNOSIS — M5451 Vertebrogenic low back pain: Secondary | ICD-10-CM | POA: Diagnosis not present

## 2023-09-06 DIAGNOSIS — M5387 Other specified dorsopathies, lumbosacral region: Secondary | ICD-10-CM | POA: Diagnosis not present

## 2023-09-06 DIAGNOSIS — M9903 Segmental and somatic dysfunction of lumbar region: Secondary | ICD-10-CM | POA: Diagnosis not present

## 2023-09-09 DIAGNOSIS — M9903 Segmental and somatic dysfunction of lumbar region: Secondary | ICD-10-CM | POA: Diagnosis not present

## 2023-09-09 DIAGNOSIS — M5441 Lumbago with sciatica, right side: Secondary | ICD-10-CM | POA: Diagnosis not present

## 2023-09-09 DIAGNOSIS — M5451 Vertebrogenic low back pain: Secondary | ICD-10-CM | POA: Diagnosis not present

## 2023-09-09 DIAGNOSIS — M5387 Other specified dorsopathies, lumbosacral region: Secondary | ICD-10-CM | POA: Diagnosis not present

## 2023-09-11 DIAGNOSIS — M5441 Lumbago with sciatica, right side: Secondary | ICD-10-CM | POA: Diagnosis not present

## 2023-09-11 DIAGNOSIS — M5451 Vertebrogenic low back pain: Secondary | ICD-10-CM | POA: Diagnosis not present

## 2023-09-11 DIAGNOSIS — M5387 Other specified dorsopathies, lumbosacral region: Secondary | ICD-10-CM | POA: Diagnosis not present

## 2023-09-11 DIAGNOSIS — M9903 Segmental and somatic dysfunction of lumbar region: Secondary | ICD-10-CM | POA: Diagnosis not present

## 2023-10-15 ENCOUNTER — Encounter (HOSPITAL_COMMUNITY): Payer: Self-pay | Admitting: Orthopedic Surgery

## 2023-11-27 DIAGNOSIS — D225 Melanocytic nevi of trunk: Secondary | ICD-10-CM | POA: Diagnosis not present

## 2023-11-27 DIAGNOSIS — Z85828 Personal history of other malignant neoplasm of skin: Secondary | ICD-10-CM | POA: Diagnosis not present

## 2023-11-27 DIAGNOSIS — L821 Other seborrheic keratosis: Secondary | ICD-10-CM | POA: Diagnosis not present

## 2023-11-27 DIAGNOSIS — L570 Actinic keratosis: Secondary | ICD-10-CM | POA: Diagnosis not present

## 2023-11-27 DIAGNOSIS — L905 Scar conditions and fibrosis of skin: Secondary | ICD-10-CM | POA: Diagnosis not present

## 2023-11-27 DIAGNOSIS — D2261 Melanocytic nevi of right upper limb, including shoulder: Secondary | ICD-10-CM | POA: Diagnosis not present

## 2023-11-27 DIAGNOSIS — L814 Other melanin hyperpigmentation: Secondary | ICD-10-CM | POA: Diagnosis not present

## 2023-11-27 DIAGNOSIS — D485 Neoplasm of uncertain behavior of skin: Secondary | ICD-10-CM | POA: Diagnosis not present

## 2024-08-11 ENCOUNTER — Other Ambulatory Visit (HOSPITAL_BASED_OUTPATIENT_CLINIC_OR_DEPARTMENT_OTHER): Payer: Self-pay | Admitting: Internal Medicine

## 2024-08-11 DIAGNOSIS — Z0000 Encounter for general adult medical examination without abnormal findings: Secondary | ICD-10-CM | POA: Diagnosis not present

## 2024-08-11 DIAGNOSIS — E782 Mixed hyperlipidemia: Secondary | ICD-10-CM

## 2024-09-01 DIAGNOSIS — M67912 Unspecified disorder of synovium and tendon, left shoulder: Secondary | ICD-10-CM | POA: Diagnosis not present

## 2024-09-01 DIAGNOSIS — M1711 Unilateral primary osteoarthritis, right knee: Secondary | ICD-10-CM | POA: Diagnosis not present

## 2024-09-07 ENCOUNTER — Ambulatory Visit (HOSPITAL_BASED_OUTPATIENT_CLINIC_OR_DEPARTMENT_OTHER)
Admission: RE | Admit: 2024-09-07 | Discharge: 2024-09-07 | Disposition: A | Discharge status: Home / Self Care | Payer: Self-pay | Source: Ambulatory Visit | Attending: Internal Medicine | Admitting: Internal Medicine

## 2024-09-07 DIAGNOSIS — E782 Mixed hyperlipidemia: Secondary | ICD-10-CM | POA: Insufficient documentation

## 2024-09-16 DIAGNOSIS — M19012 Primary osteoarthritis, left shoulder: Secondary | ICD-10-CM | POA: Diagnosis not present

## 2024-11-17 DIAGNOSIS — M25561 Pain in right knee: Secondary | ICD-10-CM | POA: Diagnosis not present

## 2024-11-22 DIAGNOSIS — M25561 Pain in right knee: Secondary | ICD-10-CM | POA: Diagnosis not present

## 2024-11-25 DIAGNOSIS — Y999 Unspecified external cause status: Secondary | ICD-10-CM | POA: Diagnosis not present

## 2024-11-25 DIAGNOSIS — S83241A Other tear of medial meniscus, current injury, right knee, initial encounter: Secondary | ICD-10-CM | POA: Diagnosis not present

## 2024-11-25 DIAGNOSIS — S83231A Complex tear of medial meniscus, current injury, right knee, initial encounter: Secondary | ICD-10-CM | POA: Diagnosis not present

## 2024-11-25 DIAGNOSIS — M6751 Plica syndrome, right knee: Secondary | ICD-10-CM | POA: Diagnosis not present

## 2024-11-25 DIAGNOSIS — M94261 Chondromalacia, right knee: Secondary | ICD-10-CM | POA: Diagnosis not present

## 2024-11-25 DIAGNOSIS — X58XXXA Exposure to other specified factors, initial encounter: Secondary | ICD-10-CM | POA: Diagnosis not present

## 2024-12-03 DIAGNOSIS — M25461 Effusion, right knee: Secondary | ICD-10-CM | POA: Diagnosis not present
